# Patient Record
Sex: Male | Born: 1945 | Race: White | Hispanic: No | Marital: Single | Smoking: Former smoker
Health system: Southern US, Community
[De-identification: ages and names within clinical notes are randomized; demographics above are authoritative.]

## PROBLEM LIST (undated history)

## (undated) DIAGNOSIS — D649 Anemia, unspecified: Secondary | ICD-10-CM

## (undated) DIAGNOSIS — D696 Thrombocytopenia, unspecified: Secondary | ICD-10-CM

## (undated) DIAGNOSIS — K589 Irritable bowel syndrome without diarrhea: Secondary | ICD-10-CM

## (undated) DIAGNOSIS — D179 Benign lipomatous neoplasm, unspecified: Secondary | ICD-10-CM

## (undated) DIAGNOSIS — K219 Gastro-esophageal reflux disease without esophagitis: Secondary | ICD-10-CM

## (undated) DIAGNOSIS — D472 Monoclonal gammopathy: Secondary | ICD-10-CM

## (undated) DIAGNOSIS — R161 Splenomegaly, not elsewhere classified: Secondary | ICD-10-CM

## (undated) DIAGNOSIS — C61 Malignant neoplasm of prostate: Secondary | ICD-10-CM

## (undated) DIAGNOSIS — Z87898 Personal history of other specified conditions: Secondary | ICD-10-CM

## (undated) HISTORY — DX: Anemia, unspecified: D64.9

## (undated) HISTORY — DX: Irritable bowel syndrome, unspecified: K58.9

## (undated) HISTORY — DX: Malignant neoplasm of prostate: C61

## (undated) HISTORY — DX: Thrombocytopenia, unspecified: D69.6

## (undated) HISTORY — PX: APPENDECTOMY: SHX54

## (undated) HISTORY — DX: Gastro-esophageal reflux disease without esophagitis: K21.9

## (undated) HISTORY — DX: Monoclonal gammopathy: D47.2

## (undated) HISTORY — DX: Personal history of other specified conditions: Z87.898

## (undated) HISTORY — DX: Benign lipomatous neoplasm, unspecified: D17.9

## (undated) HISTORY — PX: OTHER SURGICAL HISTORY: SHX169

---

## 2008-09-11 ENCOUNTER — Ambulatory Visit: Payer: Self-pay | Admitting: Orthopedic Surgery

## 2011-01-29 HISTORY — PX: OTHER SURGICAL HISTORY: SHX169

## 2011-02-24 ENCOUNTER — Ambulatory Visit: Payer: Self-pay | Admitting: Radiation Oncology

## 2011-03-01 ENCOUNTER — Ambulatory Visit: Payer: Self-pay | Admitting: Radiation Oncology

## 2011-03-24 ENCOUNTER — Ambulatory Visit: Payer: Self-pay | Admitting: Radiation Oncology

## 2011-03-30 ENCOUNTER — Ambulatory Visit: Payer: Self-pay | Admitting: Urology

## 2011-04-01 ENCOUNTER — Ambulatory Visit: Payer: Self-pay | Admitting: Radiation Oncology

## 2011-04-20 LAB — URINE IEP, RANDOM

## 2011-04-21 LAB — PROT IMMUNOELECTROPHORES(ARMC)

## 2011-04-29 ENCOUNTER — Ambulatory Visit: Payer: Self-pay | Admitting: Radiation Oncology

## 2011-05-30 ENCOUNTER — Ambulatory Visit: Payer: Self-pay | Admitting: Radiation Oncology

## 2011-08-03 ENCOUNTER — Ambulatory Visit: Payer: Self-pay | Admitting: Oncology

## 2011-08-03 LAB — CBC CANCER CENTER
Basophil %: 0.5 %
HCT: 40.6 % (ref 40.0–52.0)
HGB: 13.9 g/dL (ref 13.0–18.0)
Lymphocyte #: 1.4 x10 3/mm (ref 1.0–3.6)
Lymphocyte %: 25.9 %
MCH: 29.1 pg (ref 26.0–34.0)
MCHC: 34.1 g/dL (ref 32.0–36.0)
MCV: 85 fL (ref 80–100)
Monocyte #: 0.3 x10 3/mm (ref 0.2–1.0)
Neutrophil #: 3.6 x10 3/mm (ref 1.4–6.5)
Neutrophil %: 66.4 %
RBC: 4.77 10*6/uL (ref 4.40–5.90)
WBC: 5.5 x10 3/mm (ref 3.8–10.6)

## 2011-08-29 ENCOUNTER — Ambulatory Visit: Payer: Self-pay | Admitting: Oncology

## 2011-12-19 ENCOUNTER — Ambulatory Visit: Payer: Self-pay | Admitting: Gastroenterology

## 2012-02-29 ENCOUNTER — Ambulatory Visit: Payer: Self-pay | Admitting: Radiation Oncology

## 2012-03-17 LAB — PSA: PSA: 1.3 ng/mL (ref 0.0–4.0)

## 2012-03-31 ENCOUNTER — Ambulatory Visit: Payer: Self-pay | Admitting: Radiation Oncology

## 2012-10-29 ENCOUNTER — Ambulatory Visit: Payer: Self-pay | Admitting: Oncology

## 2012-11-16 LAB — IRON AND TIBC
Iron Bind.Cap.(Total): 365 ug/dL (ref 250–450)
Iron Saturation: 28 %
Iron: 103 ug/dL (ref 65–175)
Unbound Iron-Bind.Cap.: 262 ug/dL

## 2012-11-16 LAB — CBC CANCER CENTER
Basophil %: 0.4 %
Eosinophil %: 0.9 %
HCT: 43.5 % (ref 40.0–52.0)
HGB: 15.1 g/dL (ref 13.0–18.0)
Lymphocyte #: 1.4 x10 3/mm (ref 1.0–3.6)
Lymphocyte %: 21.7 %
Neutrophil %: 72.5 %
RDW: 15.8 % — ABNORMAL HIGH (ref 11.5–14.5)

## 2012-11-16 LAB — FOLATE: Folic Acid: 8.9 ng/mL (ref 3.1–100.0)

## 2012-11-19 LAB — PROT IMMUNOELECTROPHORES(ARMC)

## 2012-11-28 ENCOUNTER — Ambulatory Visit: Payer: Self-pay | Admitting: Oncology

## 2012-12-29 ENCOUNTER — Ambulatory Visit: Payer: Self-pay | Admitting: Oncology

## 2013-03-21 ENCOUNTER — Ambulatory Visit: Payer: Self-pay | Admitting: Oncology

## 2013-03-31 ENCOUNTER — Ambulatory Visit: Payer: Self-pay | Admitting: Oncology

## 2013-06-20 ENCOUNTER — Ambulatory Visit: Payer: Self-pay | Admitting: Oncology

## 2013-06-21 LAB — BASIC METABOLIC PANEL
Anion Gap: 10 (ref 7–16)
BUN: 13 mg/dL (ref 7–18)
CREATININE: 1.08 mg/dL (ref 0.60–1.30)
Calcium, Total: 9.2 mg/dL (ref 8.5–10.1)
Chloride: 105 mmol/L (ref 98–107)
Co2: 27 mmol/L (ref 21–32)
EGFR (African American): >60
EGFR (Non-African Amer.): >60
Glucose: 99 mg/dL (ref 65–99)
OSMOLALITY: 283 (ref 275–301)
Potassium: 4.2 mmol/L (ref 3.5–5.1)
Sodium: 142 mmol/L (ref 136–145)

## 2013-06-21 LAB — CBC CANCER CENTER
BASOS PCT: 0.2 %
Basophil #: 0 x10 3/mm (ref 0.0–0.1)
Eosinophil #: 0.1 x10 3/mm (ref 0.0–0.7)
Eosinophil %: 2 %
HCT: 41.3 % (ref 40.0–52.0)
HGB: 14 g/dL (ref 13.0–18.0)
LYMPHS PCT: 27 %
Lymphocyte #: 1.2 x10 3/mm (ref 1.0–3.6)
MCH: 28.5 pg (ref 26.0–34.0)
MCHC: 33.9 g/dL (ref 32.0–36.0)
MCV: 84 fL (ref 80–100)
MONO ABS: 0.3 x10 3/mm (ref 0.2–1.0)
Monocyte %: 6.1 %
NEUTROS PCT: 64.7 %
Neutrophil #: 2.9 x10 3/mm (ref 1.4–6.5)
PLATELETS: 121 x10 3/mm — AB (ref 150–440)
RBC: 4.92 10*6/uL (ref 4.40–5.90)
RDW: 14.7 % — ABNORMAL HIGH (ref 11.5–14.5)
WBC: 4.5 x10 3/mm (ref 3.8–10.6)

## 2013-06-24 LAB — PROT IMMUNOELECTROPHORES(ARMC)

## 2013-06-24 LAB — URINE IEP, RANDOM

## 2013-06-24 LAB — KAPPA/LAMBDA FREE LIGHT CHAINS (ARMC)

## 2013-06-28 ENCOUNTER — Ambulatory Visit: Payer: Self-pay | Admitting: Oncology

## 2013-08-08 ENCOUNTER — Ambulatory Visit: Payer: Self-pay | Admitting: Oncology

## 2013-08-28 ENCOUNTER — Ambulatory Visit: Payer: Self-pay | Admitting: Oncology

## 2013-11-01 ENCOUNTER — Ambulatory Visit: Payer: Self-pay | Admitting: Internal Medicine

## 2014-03-10 ENCOUNTER — Ambulatory Visit: Payer: Self-pay | Admitting: Oncology

## 2014-03-10 LAB — CBC CANCER CENTER
Basophil #: 0 x10 3/mm (ref 0.0–0.1)
Basophil %: 0.3 %
EOS ABS: 0.1 x10 3/mm (ref 0.0–0.7)
EOS PCT: 2.3 %
HCT: 43.3 % (ref 40.0–52.0)
HGB: 14.8 g/dL (ref 13.0–18.0)
Lymphocyte #: 1.3 x10 3/mm (ref 1.0–3.6)
Lymphocyte %: 23.3 %
MCH: 28.6 pg (ref 26.0–34.0)
MCHC: 34.3 g/dL (ref 32.0–36.0)
MCV: 83 fL (ref 80–100)
MONO ABS: 0.4 x10 3/mm (ref 0.2–1.0)
Monocyte %: 6.3 %
NEUTROS PCT: 67.8 %
Neutrophil #: 3.8 x10 3/mm (ref 1.4–6.5)
PLATELETS: 132 x10 3/mm — AB (ref 150–440)
RBC: 5.19 10*6/uL (ref 4.40–5.90)
RDW: 14.8 % — ABNORMAL HIGH (ref 11.5–14.5)
WBC: 5.7 x10 3/mm (ref 3.8–10.6)

## 2014-03-10 LAB — BASIC METABOLIC PANEL
ANION GAP: 8 (ref 7–16)
BUN: 23 mg/dL — ABNORMAL HIGH (ref 7–18)
CO2: 29 mmol/L (ref 21–32)
Calcium, Total: 8.8 mg/dL (ref 8.5–10.1)
Chloride: 102 mmol/L (ref 98–107)
Creatinine: 1.15 mg/dL (ref 0.60–1.30)
EGFR (African American): >60
EGFR (Non-African Amer.): >60
Glucose: 101 mg/dL — ABNORMAL HIGH (ref 65–99)
Osmolality: 281 (ref 275–301)
Potassium: 4.7 mmol/L (ref 3.5–5.1)
Sodium: 139 mmol/L (ref 136–145)

## 2014-03-11 LAB — KAPPA/LAMBDA FREE LIGHT CHAINS (ARMC)

## 2014-03-11 LAB — PROT IMMUNOELECTROPHORES(ARMC)

## 2014-03-13 LAB — URINE IEP, RANDOM

## 2014-03-31 ENCOUNTER — Ambulatory Visit: Payer: Self-pay | Admitting: Oncology

## 2014-04-29 ENCOUNTER — Ambulatory Visit
Admit: 2014-04-29 | Disposition: A | Discharge status: Home / Self Care | Payer: Self-pay | Attending: Oncology | Admitting: Oncology

## 2014-06-22 NOTE — H&P (Signed)
PATIENT NAME:  Anthony Gregory, Anthony Gregory MR#:  [id number] DATE OF BIRTH:  February 13, 1946  DATE OF ADMISSION:  03/30/2011  HISTORY OF PRESENT ILLNESS: Mr. Naeem is a 69 year old male who presented with a slowly rising PSA from 4.6 in 2012 to a PSA of 3 in 2013.  He underwent transrectal ultrasound-guided biopsy by Dr. Bernardo Heater showing one focus of Gleason 6 (3 + 3) from his right mid lateral lobe. All other biopsies were negative. Treatment options were discussed. He underwent consultation in my office on 02/24/2011, and it was opted he would undergo an I-125 interstitial implant. He is seen today for his preop history and physical and volume study. He is doing well and is without complaint.   PAST MEDICAL/SURGICAL HISTORY:  1. Detached tendon of his left hand in 2009.  2. Prostate cancer.  3. Irritable bowel syndrome.  4. Allergic rhinitis.   FAMILY HISTORY: Family history of hypertension and hyperlipidemia. No family history of cancer.   SOCIAL HISTORY: A 40 pack-year smoking history. Social EtOH use history.   ALLERGIES: No known allergies.   REVIEW OF SYSTEMS: Review of systems is unchanged from prior examination performed on 02/24/2011.   PHYSICAL EXAMINATION:  VITAL SIGNS: Vital signs are stable. Temperature 96, respirations 20 per minute, blood pressure is 90/49.   GENERAL: A well-developed, well-nourished male in no acute distress.   HEENT: Pupils are equal, round and reactive to light and accommodation. Extraocular movements intact. Discs not visualized.   NECK: Clear without evidence of cervical or supraclavicular adenopathy.   LUNGS: Clear to auscultation and percussion.   HEART: Regular rate and rhythm without murmur, rub or thrill.   ABDOMEN: Benign with no organomegaly or masses noted.   NEUROLOGICAL: Motor, sensory, and deep tendon reflex levels are equal and symmetric in the upper and lower extremities.   RECTAL: Exam was deferred today.   IMPRESSION: Stage I adenocarcinoma  of the prostate in a 69 year old male.   PLAN: At this point in time, the patient will have a volume study to determine seed positioning in his prostate for his I-125 interstitial implant. The risks and benefits of seed implantation were discussed with the patient, and he seems to comprehend our treatment plan well. The patient is scheduled for implant in early February. He is cleared now to proceed with implant.   Thank you for the opportunity for allowing me to continue to participate in his care.  ____________________________ Armstead Peaks, MD gsc:cbb D: 03/24/2011 11:59:40 ET T: 03/24/2011 12:21:24 ET JOB#: [id number]  cc: Armstead Peaks, MD, <Dictator> Armstead Peaks MD ELECTRONICALLY SIGNED 03/24/2011 15:05

## 2014-09-03 ENCOUNTER — Other Ambulatory Visit: Payer: Self-pay

## 2014-09-03 DIAGNOSIS — D472 Monoclonal gammopathy: Secondary | ICD-10-CM

## 2014-09-04 ENCOUNTER — Inpatient Hospital Stay: Payer: Medicare Other | Attending: Oncology

## 2014-09-04 DIAGNOSIS — Z7982 Long term (current) use of aspirin: Secondary | ICD-10-CM | POA: Diagnosis not present

## 2014-09-04 DIAGNOSIS — K219 Gastro-esophageal reflux disease without esophagitis: Secondary | ICD-10-CM | POA: Insufficient documentation

## 2014-09-04 DIAGNOSIS — K589 Irritable bowel syndrome without diarrhea: Secondary | ICD-10-CM | POA: Insufficient documentation

## 2014-09-04 DIAGNOSIS — D649 Anemia, unspecified: Secondary | ICD-10-CM | POA: Diagnosis not present

## 2014-09-04 DIAGNOSIS — Z87891 Personal history of nicotine dependence: Secondary | ICD-10-CM | POA: Diagnosis not present

## 2014-09-04 DIAGNOSIS — D696 Thrombocytopenia, unspecified: Secondary | ICD-10-CM | POA: Diagnosis not present

## 2014-09-04 DIAGNOSIS — R161 Splenomegaly, not elsewhere classified: Secondary | ICD-10-CM | POA: Diagnosis not present

## 2014-09-04 DIAGNOSIS — D472 Monoclonal gammopathy: Secondary | ICD-10-CM | POA: Diagnosis present

## 2014-09-04 DIAGNOSIS — Z8546 Personal history of malignant neoplasm of prostate: Secondary | ICD-10-CM | POA: Insufficient documentation

## 2014-09-04 LAB — CBC
HEMATOCRIT: 42.9 % (ref 40.0–52.0)
Hemoglobin: 14.5 g/dL (ref 13.0–18.0)
MCH: 28.7 pg (ref 26.0–34.0)
MCHC: 33.8 g/dL (ref 32.0–36.0)
MCV: 84.8 fL (ref 80.0–100.0)
Platelets: 120 10*3/uL — ABNORMAL LOW (ref 150–440)
RBC: 5.06 MIL/uL (ref 4.40–5.90)
RDW: 14.7 % — AB (ref 11.5–14.5)
WBC: 4.2 10*3/uL (ref 3.8–10.6)

## 2014-09-04 LAB — BASIC METABOLIC PANEL
ANION GAP: 4 — AB (ref 5–15)
BUN: 20 mg/dL (ref 6–20)
CO2: 25 mmol/L (ref 22–32)
CREATININE: 1.18 mg/dL (ref 0.61–1.24)
Calcium: 8.6 mg/dL — ABNORMAL LOW (ref 8.9–10.3)
Chloride: 105 mmol/L (ref 101–111)
GLUCOSE: 101 mg/dL — AB (ref 65–99)
POTASSIUM: 4.1 mmol/L (ref 3.5–5.1)
Sodium: 134 mmol/L — ABNORMAL LOW (ref 135–145)

## 2014-09-05 LAB — PROTEIN ELECTROPHORESIS, SERUM
A/G Ratio: 1.4 (ref 0.7–1.7)
ALBUMIN ELP: 3.9 g/dL (ref 2.9–4.4)
Alpha-1-Globulin: 0.2 g/dL (ref 0.0–0.4)
Alpha-2-Globulin: 0.4 g/dL (ref 0.4–1.0)
Beta Globulin: 1.4 g/dL — ABNORMAL HIGH (ref 0.7–1.3)
GAMMA GLOBULIN: 0.8 g/dL (ref 0.4–1.8)
Globulin, Total: 2.8 g/dL (ref 2.2–3.9)
M-Spike, %: 0.1 g/dL — ABNORMAL HIGH
Total Protein ELP: 6.7 g/dL (ref 6.0–8.5)

## 2014-09-05 LAB — PROTEIN ELECTRO, RANDOM URINE
Albumin ELP, Urine: 23.5 %
Alpha-1-Globulin, U: 4.4 %
Alpha-2-Globulin, U: 10 %
Beta Globulin, U: 46.4 %
GAMMA GLOBULIN, U: 15.7 %
Total Protein, Urine: 7.2 mg/dL

## 2014-09-05 LAB — KAPPA/LAMBDA LIGHT CHAINS
Kappa free light chain: 28.36 mg/L — ABNORMAL HIGH (ref 3.30–19.40)
Kappa, lambda light chain ratio: 4.48 — ABNORMAL HIGH (ref 0.26–1.65)
LAMDA FREE LIGHT CHAINS: 6.33 mg/L (ref 5.71–26.30)

## 2014-09-11 ENCOUNTER — Ambulatory Visit: Payer: Self-pay | Admitting: Oncology

## 2014-09-15 ENCOUNTER — Encounter: Payer: Self-pay | Admitting: Oncology

## 2014-09-15 ENCOUNTER — Inpatient Hospital Stay (HOSPITAL_BASED_OUTPATIENT_CLINIC_OR_DEPARTMENT_OTHER): Payer: Medicare Other | Admitting: Oncology

## 2014-09-15 VITALS — BP 142/77 | HR 67 | Temp 96.2°F | Resp 20 | Wt 190.9 lb

## 2014-09-15 DIAGNOSIS — Z7982 Long term (current) use of aspirin: Secondary | ICD-10-CM

## 2014-09-15 DIAGNOSIS — K589 Irritable bowel syndrome without diarrhea: Secondary | ICD-10-CM | POA: Diagnosis not present

## 2014-09-15 DIAGNOSIS — D472 Monoclonal gammopathy: Secondary | ICD-10-CM

## 2014-09-15 DIAGNOSIS — K219 Gastro-esophageal reflux disease without esophagitis: Secondary | ICD-10-CM

## 2014-09-15 DIAGNOSIS — D649 Anemia, unspecified: Secondary | ICD-10-CM | POA: Diagnosis not present

## 2014-09-15 DIAGNOSIS — R161 Splenomegaly, not elsewhere classified: Secondary | ICD-10-CM

## 2014-09-15 DIAGNOSIS — Z87891 Personal history of nicotine dependence: Secondary | ICD-10-CM

## 2014-09-15 DIAGNOSIS — C61 Malignant neoplasm of prostate: Secondary | ICD-10-CM

## 2014-09-15 DIAGNOSIS — D696 Thrombocytopenia, unspecified: Secondary | ICD-10-CM

## 2014-09-15 DIAGNOSIS — Z8546 Personal history of malignant neoplasm of prostate: Secondary | ICD-10-CM

## 2014-09-27 NOTE — Progress Notes (Signed)
[place]  Telephone:(336) [phone] Fax:(336) [phone]  ID: Anthony Gregory OB: [birth year]  MR#: [id number]  XBL#:[id number]  Patient Care Team: Adin Hector, MD as PCP - General (Internal Medicine)  CHIEF COMPLAINT:  Chief Complaint  Patient presents with  . Follow-up    MGUS, thrombocytopenia    INTERVAL HISTORY: Patient returns to clinic today for further evaluation and discussion of his laboratory work.  He continues to feel well and is asymptomatic.  He denies any recent fevers or illnesses.  He has a good appetite and denies weight loss.  He has no bony pain.  He has no neurologic complaints.  He denies any easy bleeding or bruising.  He has no chest pain or shortness of breath.  He denies any nausea, vomiting, constipation, or diarrhea.  He has no melena or hematochezia.  He has no urinary complaints.  Patient offers no specific complaints today.   REVIEW OF SYSTEMS:   Review of Systems  Constitutional: Negative.   Musculoskeletal: Negative.   Neurological: Negative.     As per HPI. Otherwise, a complete review of systems is negatve.  PAST MEDICAL HISTORY: Past Medical History  Diagnosis Date  . Irritable bowel syndrome   . GERD (gastroesophageal reflux disease)   . History of splenomegaly   . Prostate cancer   . Anemia   . Thrombocytopenia   . Lipoma   . MGUS (monoclonal gammopathy of unknown significance)     PAST SURGICAL HISTORY: No past surgical history on file.  FAMILY HISTORY: Hypertension, hyperlipidemia.     ADVANCED DIRECTIVES:    HEALTH MAINTENANCE: History  Substance Use Topics  . Smoking status: Former Research scientist (life sciences)  . Smokeless tobacco: Never Used  . Alcohol Use: No     Colonoscopy:  PAP:  Bone density:  Lipid panel:  No Known Allergies  Current Outpatient Prescriptions  Medication Sig Dispense Refill  . aspirin EC 81 MG tablet Take 81 mg by mouth.     No current facility-administered medications for this  visit.    OBJECTIVE: Filed Vitals:   09/15/14 1107  BP: 142/77  Pulse: 67  Temp: 96.2 F (35.7 C)  Resp: 20     There is no height on file to calculate BMI.    ECOG FS:0 - Asymptomatic  General: Well-developed, well-nourished, no acute distress. Eyes: Pink conjunctiva, anicteric sclera. Lungs: Clear to auscultation bilaterally. Heart: Regular rate and rhythm. No rubs, murmurs, or gallops. Abdomen: Soft, nontender, nondistended. No organomegaly noted, normoactive bowel sounds. Musculoskeletal: No edema, cyanosis, or clubbing. Neuro: Alert, answering all questions appropriately. Cranial nerves grossly intact. Skin: No rashes or petechiae noted. Psych: Normal affect.   LAB RESULTS:  Lab Results  Component Value Date   NA 134* 09/04/2014   K 4.1 09/04/2014   CL 105 09/04/2014   CO2 25 09/04/2014   GLUCOSE 101* 09/04/2014   BUN 20 09/04/2014   CREATININE 1.18 09/04/2014   CALCIUM 8.6* 09/04/2014   GFRNONAA >60 09/04/2014   GFRAA >60 09/04/2014    Lab Results  Component Value Date   WBC 4.2 09/04/2014   NEUTROABS 3.8 03/10/2014   HGB 14.5 09/04/2014   HCT 42.9 09/04/2014   MCV 84.8 09/04/2014   PLT 120* 09/04/2014     STUDIES: No results found.  ASSESSMENT: MGUS, thrombocytopenia.  PLAN:    1.  MGUS: Patient most recent M spike 0.1 down from 0.46 months ago. He has a mildly elevated Lambda light chain ratio at 4.48.  Patient also had a metastatic bone survey in the past which was negative.  This does not need to be repeated unless there is suspicion of progression of disease. Return to clinic in 6 months with repeat laboratory work and further evaluation.  2.  Thrombocytopenia: Decreased but essentially unchanged.  Patient previously reported he has splenomegaly, but we do not have documentation of this. 3.  Anemia: Resolved.  Patient's hemoglobin and iron stores are within normal limits. 4.  History of prostate cancer: Patient's PSAs are monitored by his  PCP.   Patient expressed understanding and was in agreement with this plan. He also understands that He can call clinic at any time with any questions, concerns, or complaints.    [name], MD   09/27/2014 3:56 PM

## 2015-03-18 ENCOUNTER — Inpatient Hospital Stay: Payer: Medicare Other | Attending: Oncology

## 2015-03-18 DIAGNOSIS — D472 Monoclonal gammopathy: Secondary | ICD-10-CM | POA: Insufficient documentation

## 2015-03-18 DIAGNOSIS — C61 Malignant neoplasm of prostate: Secondary | ICD-10-CM

## 2015-03-18 DIAGNOSIS — D179 Benign lipomatous neoplasm, unspecified: Secondary | ICD-10-CM | POA: Diagnosis not present

## 2015-03-18 DIAGNOSIS — Z7982 Long term (current) use of aspirin: Secondary | ICD-10-CM | POA: Diagnosis not present

## 2015-03-18 DIAGNOSIS — D649 Anemia, unspecified: Secondary | ICD-10-CM | POA: Insufficient documentation

## 2015-03-18 DIAGNOSIS — Z87891 Personal history of nicotine dependence: Secondary | ICD-10-CM | POA: Insufficient documentation

## 2015-03-18 DIAGNOSIS — K589 Irritable bowel syndrome without diarrhea: Secondary | ICD-10-CM | POA: Insufficient documentation

## 2015-03-18 DIAGNOSIS — Z8546 Personal history of malignant neoplasm of prostate: Secondary | ICD-10-CM | POA: Insufficient documentation

## 2015-03-18 DIAGNOSIS — K219 Gastro-esophageal reflux disease without esophagitis: Secondary | ICD-10-CM | POA: Insufficient documentation

## 2015-03-18 DIAGNOSIS — D696 Thrombocytopenia, unspecified: Secondary | ICD-10-CM | POA: Insufficient documentation

## 2015-03-18 LAB — BASIC METABOLIC PANEL
Anion gap: 5 (ref 5–15)
BUN: 13 mg/dL (ref 6–20)
CHLORIDE: 100 mmol/L — AB (ref 101–111)
CO2: 28 mmol/L (ref 22–32)
Calcium: 9.2 mg/dL (ref 8.9–10.3)
Creatinine, Ser: 1.1 mg/dL (ref 0.61–1.24)
GFR calc Af Amer: >60 mL/min (ref >60)
Glucose, Bld: 107 mg/dL — ABNORMAL HIGH (ref 65–99)
POTASSIUM: 3.9 mmol/L (ref 3.5–5.1)
Sodium: 133 mmol/L — ABNORMAL LOW (ref 135–145)

## 2015-03-18 LAB — CBC WITH DIFFERENTIAL/PLATELET
BASOS ABS: 0 10*3/uL (ref 0–0.1)
Basophils Relative: 1 %
EOS ABS: 0 10*3/uL (ref 0–0.7)
EOS PCT: 1 %
HCT: 43.7 % (ref 40.0–52.0)
Hemoglobin: 15.1 g/dL (ref 13.0–18.0)
LYMPHS ABS: 1.3 10*3/uL (ref 1.0–3.6)
LYMPHS PCT: 23 %
MCH: 28.5 pg (ref 26.0–34.0)
MCHC: 34.5 g/dL (ref 32.0–36.0)
MCV: 82.7 fL (ref 80.0–100.0)
Monocytes Absolute: 0.4 10*3/uL (ref 0.2–1.0)
Monocytes Relative: 7 %
NEUTROS PCT: 68 %
Neutro Abs: 3.9 10*3/uL (ref 1.4–6.5)
Platelets: 139 10*3/uL — ABNORMAL LOW (ref 150–440)
RBC: 5.29 MIL/uL (ref 4.40–5.90)
RDW: 14.6 % — ABNORMAL HIGH (ref 11.5–14.5)
WBC: 5.6 10*3/uL (ref 3.8–10.6)

## 2015-03-18 LAB — PSA: PSA: 0.33 ng/mL (ref 0.00–4.00)

## 2015-03-19 LAB — PROTEIN ELECTROPHORESIS, SERUM
A/G Ratio: 1.2 (ref 0.7–1.7)
Albumin ELP: 4.1 g/dL (ref 2.9–4.4)
Alpha-1-Globulin: 0.2 g/dL (ref 0.0–0.4)
Alpha-2-Globulin: 0.5 g/dL (ref 0.4–1.0)
Beta Globulin: 1.6 g/dL — ABNORMAL HIGH (ref 0.7–1.3)
GAMMA GLOBULIN: 0.9 g/dL (ref 0.4–1.8)
Globulin, Total: 3.3 g/dL (ref 2.2–3.9)
M-SPIKE, %: 0.1 g/dL — AB
Total Protein ELP: 7.4 g/dL (ref 6.0–8.5)

## 2015-03-20 LAB — PROTEIN ELECTRO, RANDOM URINE
Albumin ELP, Urine: 50.5 %
Alpha-1-Globulin, U: 2.1 %
Alpha-2-Globulin, U: 12.4 %
Beta Globulin, U: 29.9 %
Gamma Globulin, U: 5.1 %
Total Protein, Urine: 7.3 mg/dL

## 2015-03-25 ENCOUNTER — Inpatient Hospital Stay (HOSPITAL_BASED_OUTPATIENT_CLINIC_OR_DEPARTMENT_OTHER): Payer: Medicare Other | Admitting: Oncology

## 2015-03-25 VITALS — BP 150/83 | HR 67 | Temp 97.1°F | Resp 18 | Wt 200.6 lb

## 2015-03-25 DIAGNOSIS — Z8546 Personal history of malignant neoplasm of prostate: Secondary | ICD-10-CM

## 2015-03-25 DIAGNOSIS — K589 Irritable bowel syndrome without diarrhea: Secondary | ICD-10-CM

## 2015-03-25 DIAGNOSIS — D696 Thrombocytopenia, unspecified: Secondary | ICD-10-CM | POA: Diagnosis not present

## 2015-03-25 DIAGNOSIS — D472 Monoclonal gammopathy: Secondary | ICD-10-CM | POA: Diagnosis not present

## 2015-03-25 DIAGNOSIS — Z7982 Long term (current) use of aspirin: Secondary | ICD-10-CM | POA: Diagnosis not present

## 2015-03-25 DIAGNOSIS — D179 Benign lipomatous neoplasm, unspecified: Secondary | ICD-10-CM

## 2015-03-25 DIAGNOSIS — D649 Anemia, unspecified: Secondary | ICD-10-CM

## 2015-03-25 DIAGNOSIS — K219 Gastro-esophageal reflux disease without esophagitis: Secondary | ICD-10-CM

## 2015-03-25 DIAGNOSIS — Z87891 Personal history of nicotine dependence: Secondary | ICD-10-CM

## 2015-03-27 NOTE — Progress Notes (Signed)
[place]  Telephone:(336) [phone] Fax:(336) [phone]  ID: Anthony Gregory OB: [birth year]  MR#: [id number]  [id number]  Patient Care Team: Adin Hector, MD as PCP - General (Internal Medicine)  CHIEF COMPLAINT:  Chief Complaint  Patient presents with  . MGUS    INTERVAL HISTORY: Patient returns to clinic today for further evaluation and discussion of his laboratory work.  He continues to feel well and is asymptomatic.  He denies any recent fevers or illnesses.  He has a good appetite and denies weight loss.  He has no bony pain.  He has no neurologic complaints.  He denies any easy bleeding or bruising.  He has no chest pain or shortness of breath.  He denies any nausea, vomiting, constipation, or diarrhea.  He has no melena or hematochezia.  He has no urinary complaints.  Patient offers no specific complaints today.   REVIEW OF SYSTEMS:   Review of Systems  Constitutional: Negative for fever and malaise/fatigue.  Respiratory: Negative.  Negative for shortness of breath.   Cardiovascular: Negative.  Negative for chest pain.  Gastrointestinal: Negative.   Musculoskeletal: Negative.   Neurological: Negative.  Negative for weakness.    As per HPI. Otherwise, a complete review of systems is negatve.  PAST MEDICAL HISTORY: Past Medical History  Diagnosis Date  . Irritable bowel syndrome   . GERD (gastroesophageal reflux disease)   . History of splenomegaly   . Prostate cancer   . Anemia   . Thrombocytopenia   . Lipoma   . MGUS (monoclonal gammopathy of unknown significance)     PAST SURGICAL HISTORY: No past surgical history on file.  FAMILY HISTORY: Hypertension, hyperlipidemia.     ADVANCED DIRECTIVES:    HEALTH MAINTENANCE: Social History  Substance Use Topics  . Smoking status: Former Research scientist (life sciences)  . Smokeless tobacco: Never Used  . Alcohol Use: No     Colonoscopy:  PAP:  Bone density:  Lipid panel:  No Known  Allergies  Current Outpatient Prescriptions  Medication Sig Dispense Refill  . aspirin EC 81 MG tablet Take 81 mg by mouth.     No current facility-administered medications for this visit.    OBJECTIVE: Filed Vitals:   03/25/15 0951  BP: 150/83  Pulse: 67  Temp: 97.1 F (36.2 C)  Resp: 18     There is no height on file to calculate BMI.    ECOG FS:0 - Asymptomatic  General: Well-developed, well-nourished, no acute distress. Eyes: Pink conjunctiva, anicteric sclera. Lungs: Clear to auscultation bilaterally. Heart: Regular rate and rhythm. No rubs, murmurs, or gallops. Abdomen: Soft, nontender, nondistended. No organomegaly noted, normoactive bowel sounds. Musculoskeletal: No edema, cyanosis, or clubbing. Neuro: Alert, answering all questions appropriately. Cranial nerves grossly intact. Skin: No rashes or petechiae noted. Psych: Normal affect.   LAB RESULTS:  Lab Results  Component Value Date   NA 133* 03/18/2015   K 3.9 03/18/2015   CL 100* 03/18/2015   CO2 28 03/18/2015   GLUCOSE 107* 03/18/2015   BUN 13 03/18/2015   CREATININE 1.10 03/18/2015   CALCIUM 9.2 03/18/2015   GFRNONAA >60 03/18/2015   GFRAA >60 03/18/2015    Lab Results  Component Value Date   WBC 5.6 03/18/2015   NEUTROABS 3.9 03/18/2015   HGB 15.1 03/18/2015   HCT 43.7 03/18/2015   MCV 82.7 03/18/2015   PLT 139* 03/18/2015     STUDIES: No results found.  ASSESSMENT: MGUS, thrombocytopenia.  PLAN:    1.  MGUS: Patient most recent serum M spike is unchanged at 0.1. He does not have a M spike in his urine. He has no evidence of end organ damage. Patient also had a metastatic bone survey in the past which was negative.  This does not need to be repeated unless there is suspicion of progression of disease. Return to clinic in 1 year with repeat laboratory work and further evaluation.  2.  Thrombocytopenia: Decreased but essentially unchanged.  Patient previously reported he has splenomegaly,  but we do not have documentation of this. 3.  Anemia: Resolved.  Patient's hemoglobin and iron stores are within normal limits. 4.  History of prostate cancer: PSA 0.33.  Monitor.   Patient expressed understanding and was in agreement with this plan. He also understands that He can call clinic at any time with any questions, concerns, or complaints.    [name], MD   03/27/2015 9:33 AM

## 2015-04-09 ENCOUNTER — Ambulatory Visit: Payer: Self-pay | Admitting: Radiation Oncology

## 2015-04-16 ENCOUNTER — Encounter: Payer: Self-pay | Admitting: Radiation Oncology

## 2015-04-16 ENCOUNTER — Ambulatory Visit
Admission: RE | Admit: 2015-04-16 | Discharge: 2015-04-16 | Disposition: A | Discharge status: Home / Self Care | Payer: Medicare Other | Source: Ambulatory Visit | Attending: Radiation Oncology | Admitting: Radiation Oncology

## 2015-04-16 VITALS — BP 141/83 | HR 72 | Temp 96.0°F | Wt 194.8 lb

## 2015-04-16 DIAGNOSIS — C61 Malignant neoplasm of prostate: Secondary | ICD-10-CM

## 2015-04-16 NOTE — Progress Notes (Signed)
Radiation Oncology Follow up Note  Name: Anthony Gregory   Date:   04/16/2015 MRN:  [id number] DOB: [birth year]    This 70 y.o. male presents to the clinic today for follow-up for prostate cancer Gleason 6 now out 3 years status post I-125 interstitial implant.  REFERRING PROVIDER: Adin Hector, MD  HPI: Patient is a 70 year old male now out over 3 years having completed I-125 interstitial implant back in 2012 for a Gleason 6 (3+3) adenocarcinoma the prostate presenting the PSA of 4.6. Seen today in routine follow-up he continues to do well last PSA was in January was 0.3. He specifically denies diarrhea dysuria or any other GI/GU complaints..  COMPLICATIONS OF TREATMENT: none  FOLLOW UP COMPLIANCE: keeps appointments   PHYSICAL EXAM:  BP 141/83 mmHg  Pulse 72  Temp(Src) 96 F (35.6 C)  Wt 194 lb 12.4 oz (88.35 kg) On rectal exam rectal sphincter tone is good. Prostate is smooth contracted without evidence of nodularity or mass. Sulcus is preserved bilaterally. No discrete nodularity is identified. No other rectal abnormalities are noted. Well-developed well-nourished patient in NAD. HEENT reveals PERLA, EOMI, discs not visualized.  Oral cavity is clear. No oral mucosal lesions are identified. Neck is clear without evidence of cervical or supraclavicular adenopathy. Lungs are clear to A&P. Cardiac examination is essentially unremarkable with regular rate and rhythm without murmur rub or thrill. Abdomen is benign with no organomegaly or masses noted. Motor sensory and DTR levels are equal and symmetric in the upper and lower extremities. Cranial nerves II through XII are grossly intact. Proprioception is intact. No peripheral adenopathy or edema is identified. No motor or sensory levels are noted. Crude visual fields are within normal range.  RADIOLOGY RESULTS: No current films for review  PLAN: Present time he continues to do well under good biochemical control of his prostate cancer.  I am please was overall progress. I've asked to see him back in a year for follow-up. Patient knows to call sooner with any concerns. He continues follow-up care with medical oncology for anemia and thrombocytopenia which appears to be stable.  I would like to take this opportunity for allowing me to participate in the care of your patient.Armstead Peaks., MD

## 2015-12-25 ENCOUNTER — Encounter: Payer: Self-pay | Admitting: *Deleted

## 2016-03-18 ENCOUNTER — Other Ambulatory Visit: Payer: Self-pay | Admitting: *Deleted

## 2016-03-18 DIAGNOSIS — D472 Monoclonal gammopathy: Secondary | ICD-10-CM

## 2016-03-24 ENCOUNTER — Other Ambulatory Visit: Payer: Medicare Other

## 2016-03-31 ENCOUNTER — Ambulatory Visit: Payer: Medicare Other | Admitting: Oncology

## 2016-04-19 ENCOUNTER — Inpatient Hospital Stay: Payer: Medicare Other | Attending: Oncology

## 2016-04-19 DIAGNOSIS — K219 Gastro-esophageal reflux disease without esophagitis: Secondary | ICD-10-CM | POA: Diagnosis not present

## 2016-04-19 DIAGNOSIS — Z8546 Personal history of malignant neoplasm of prostate: Secondary | ICD-10-CM | POA: Diagnosis not present

## 2016-04-19 DIAGNOSIS — Z7982 Long term (current) use of aspirin: Secondary | ICD-10-CM | POA: Diagnosis not present

## 2016-04-19 DIAGNOSIS — K589 Irritable bowel syndrome without diarrhea: Secondary | ICD-10-CM | POA: Insufficient documentation

## 2016-04-19 DIAGNOSIS — D696 Thrombocytopenia, unspecified: Secondary | ICD-10-CM | POA: Insufficient documentation

## 2016-04-19 DIAGNOSIS — Z923 Personal history of irradiation: Secondary | ICD-10-CM | POA: Insufficient documentation

## 2016-04-19 DIAGNOSIS — Z87891 Personal history of nicotine dependence: Secondary | ICD-10-CM | POA: Diagnosis not present

## 2016-04-19 DIAGNOSIS — D472 Monoclonal gammopathy: Secondary | ICD-10-CM | POA: Diagnosis not present

## 2016-04-19 LAB — BASIC METABOLIC PANEL
ANION GAP: 6 (ref 5–15)
BUN: 17 mg/dL (ref 6–20)
CHLORIDE: 107 mmol/L (ref 101–111)
CO2: 25 mmol/L (ref 22–32)
Calcium: 9.1 mg/dL (ref 8.9–10.3)
Creatinine, Ser: 1.2 mg/dL (ref 0.61–1.24)
GFR calc Af Amer: >60 mL/min (ref >60)
GFR calc non Af Amer: 60 mL/min — ABNORMAL LOW (ref >60)
GLUCOSE: 97 mg/dL (ref 65–99)
POTASSIUM: 4.2 mmol/L (ref 3.5–5.1)
Sodium: 138 mmol/L (ref 135–145)

## 2016-04-19 LAB — CBC
HEMATOCRIT: 41.4 % (ref 40.0–52.0)
HEMOGLOBIN: 14.3 g/dL (ref 13.0–18.0)
MCH: 29 pg (ref 26.0–34.0)
MCHC: 34.6 g/dL (ref 32.0–36.0)
MCV: 83.9 fL (ref 80.0–100.0)
PLATELETS: 138 10*3/uL — AB (ref 150–440)
RBC: 4.93 MIL/uL (ref 4.40–5.90)
RDW: 14.6 % — ABNORMAL HIGH (ref 11.5–14.5)
WBC: 5.7 10*3/uL (ref 3.8–10.6)

## 2016-04-20 LAB — PROTEIN ELECTROPHORESIS, SERUM
A/G RATIO SPE: 1.4 (ref 0.7–1.7)
ALBUMIN ELP: 3.8 g/dL (ref 2.9–4.4)
ALPHA-1-GLOBULIN: 0.2 g/dL (ref 0.0–0.4)
ALPHA-2-GLOBULIN: 0.5 g/dL (ref 0.4–1.0)
BETA GLOBULIN: 1.3 g/dL (ref 0.7–1.3)
GAMMA GLOBULIN: 0.8 g/dL (ref 0.4–1.8)
Globulin, Total: 2.8 (ref 2.2–3.9)
M-Spike, %: 0.1 g/dL — ABNORMAL HIGH
Total Protein ELP: 6.6 g/dL (ref 6.0–8.5)

## 2016-04-20 LAB — KAPPA/LAMBDA LIGHT CHAINS
KAPPA, LAMDA LIGHT CHAIN RATIO: 2.27 — AB (ref 0.26–1.65)
Kappa free light chain: 22.2 mg/L — ABNORMAL HIGH (ref 3.3–19.4)
Lambda free light chains: 9.8 mg/L (ref 5.7–26.3)

## 2016-04-22 ENCOUNTER — Ambulatory Visit: Payer: Medicare Other | Admitting: Radiation Oncology

## 2016-04-25 NOTE — Progress Notes (Signed)
[place]  Telephone:(336) [phone] Fax:(336) [phone]  ID: Caleen Essex OB: 07-04-1945  MR#: [id number]  [id number]  Patient Care Team: Adin Hector, MD as PCP - General (Internal Medicine)  CHIEF COMPLAINT: MGUS, thrombocytopenia.  INTERVAL HISTORY: Patient returns to clinic today for routine yearly evaluation and discussion of his laboratory work.  He continues to feel well and is asymptomatic.  He denies any recent fevers or illnesses.  He has a good appetite and denies weight loss.  He has no bony pain.  He has no neurologic complaints.  He denies any easy bleeding or bruising.  He has no chest pain or shortness of breath.  He denies any nausea, vomiting, constipation, or diarrhea.  He has no melena or hematochezia.  He has no urinary complaints.  Patient offers no specific complaints today.   REVIEW OF SYSTEMS:   Review of Systems  Constitutional: Negative for fever, malaise/fatigue and weight loss.  Respiratory: Negative.  Negative for cough and shortness of breath.   Cardiovascular: Negative.  Negative for chest pain and leg swelling.  Gastrointestinal: Negative.  Negative for abdominal pain.  Musculoskeletal: Negative.   Neurological: Negative.  Negative for weakness.  Psychiatric/Behavioral: Negative.  The patient is not nervous/anxious.     As per HPI. Otherwise, a complete review of systems is negative.  PAST MEDICAL HISTORY: Past Medical History:  Diagnosis Date  . Anemia   . GERD (gastroesophageal reflux disease)   . History of splenomegaly   . Irritable bowel syndrome   . Lipoma   . MGUS (monoclonal gammopathy of unknown significance)   . [place]state cancer ([place])   . Thrombocytopenia ([place])     PAST SURGICAL HISTORY: No past surgical history on file.  FAMILY HISTORY: Hypertension, hyperlipidemia.     ADVANCED DIRECTIVES:    HEALTH MAINTENANCE: Social History  Substance Use Topics  . Smoking status: Former Research scientist (life sciences)  .  Smokeless tobacco: Never Used  . Alcohol use No     Colonoscopy:  PAP:  Bone density:  Lipid panel:  No Known Allergies  Current Outpatient Prescriptions  Medication Sig Dispense Refill  . aspirin EC 81 MG tablet Take 81 mg by mouth.     No current facility-administered medications for this visit.     OBJECTIVE: Vitals:   04/26/16 1122  BP: (!) 158/81  Pulse: 68  Resp: 18  Temp: 97 F (36.1 C)     There is no height or weight on file to calculate BMI.    ECOG FS:0 - Asymptomatic  General: Well-developed, well-nourished, no acute distress. Eyes: Pink conjunctiva, anicteric sclera. Lungs: Clear to auscultation bilaterally. Heart: Regular rate and rhythm. No rubs, murmurs, or gallops. Abdomen: Soft, nontender, nondistended. No organomegaly noted, normoactive bowel sounds. Musculoskeletal: No edema, cyanosis, or clubbing. Neuro: Alert, answering all questions appropriately. Cranial nerves grossly intact. Skin: No rashes or petechiae noted. Psych: Normal affect.   LAB RESULTS:  Lab Results  Component Value Date   NA 138 04/19/2016   K 4.2 04/19/2016   CL 107 04/19/2016   CO2 25 04/19/2016   GLUCOSE 97 04/19/2016   BUN 17 04/19/2016   CREATININE 1.20 04/19/2016   CALCIUM 9.1 04/19/2016   GFRNONAA 60 (L) 04/19/2016   GFRAA >60 04/19/2016    Lab Results  Component Value Date   WBC 5.7 04/19/2016   NEUTROABS 3.9 03/18/2015   HGB 14.3 04/19/2016   HCT 41.4 04/19/2016   MCV 83.9 04/19/2016   PLT 138 (L) 04/19/2016  Lab Results  Component Value Date   TOTALPROTELP 6.6 04/19/2016   ALBUMINELP 3.8 04/19/2016   A1GS 0.2 04/19/2016   A2GS 0.5 04/19/2016   BETS 1.3 04/19/2016   GAMS 0.8 04/19/2016   MSPIKE 0.1 (H) 04/19/2016   SPEI Comment 04/19/2016     STUDIES: No results found.  ASSESSMENT: MGUS, thrombocytopenia.  PLAN:    1.  MGUS: Patient most recent serum M spike is 0.1 which is unchanged since July 2016. He does not have a M spike in his  urine. He has no evidence of end organ damage. Patient also had a metastatic bone survey in the past which was negative. This does not need to be repeated unless there is suspicion of progression of disease. Return to clinic in 1 year with repeat laboratory work and further evaluation.  2.  Thrombocytopenia: Decreased but essentially unchanged.  Patient previously reported he has splenomegaly, but we do not have documentation of this. 3.  Anemia: Resolved.  Patient's hemoglobin and iron stores are within normal limits. 4.  History of prostate cancer: Patient has had XRT past. Continue follow-up with radiation oncology as indicated.  Patient expressed understanding and was in agreement with this plan. He also understands that He can call clinic at any time with any questions, concerns, or complaints.    [name], MD   04/30/2016 7:47 AM

## 2016-04-26 ENCOUNTER — Inpatient Hospital Stay (HOSPITAL_BASED_OUTPATIENT_CLINIC_OR_DEPARTMENT_OTHER): Payer: Medicare Other | Admitting: Oncology

## 2016-04-26 VITALS — BP 158/81 | HR 68 | Temp 97.0°F | Resp 18 | Wt 199.1 lb

## 2016-04-26 DIAGNOSIS — Z8546 Personal history of malignant neoplasm of prostate: Secondary | ICD-10-CM | POA: Diagnosis not present

## 2016-04-26 DIAGNOSIS — Z87891 Personal history of nicotine dependence: Secondary | ICD-10-CM | POA: Diagnosis not present

## 2016-04-26 DIAGNOSIS — Z7982 Long term (current) use of aspirin: Secondary | ICD-10-CM | POA: Diagnosis not present

## 2016-04-26 DIAGNOSIS — D696 Thrombocytopenia, unspecified: Secondary | ICD-10-CM | POA: Diagnosis not present

## 2016-04-26 DIAGNOSIS — K219 Gastro-esophageal reflux disease without esophagitis: Secondary | ICD-10-CM

## 2016-04-26 DIAGNOSIS — K589 Irritable bowel syndrome without diarrhea: Secondary | ICD-10-CM

## 2016-04-26 DIAGNOSIS — Z923 Personal history of irradiation: Secondary | ICD-10-CM

## 2016-04-26 DIAGNOSIS — D472 Monoclonal gammopathy: Secondary | ICD-10-CM | POA: Diagnosis not present

## 2016-04-26 NOTE — Progress Notes (Signed)
Patient offers no complaints today. 

## 2016-05-03 ENCOUNTER — Other Ambulatory Visit: Payer: Self-pay | Admitting: *Deleted

## 2016-05-03 ENCOUNTER — Other Ambulatory Visit: Payer: Self-pay

## 2016-05-03 ENCOUNTER — Encounter: Payer: Self-pay | Admitting: Radiation Oncology

## 2016-05-03 ENCOUNTER — Ambulatory Visit
Admission: RE | Admit: 2016-05-03 | Discharge: 2016-05-03 | Disposition: A | Discharge status: Home / Self Care | Payer: Medicare Other | Source: Ambulatory Visit | Attending: Radiation Oncology | Admitting: Radiation Oncology

## 2016-05-03 ENCOUNTER — Inpatient Hospital Stay: Payer: Medicare Other | Attending: Radiation Oncology

## 2016-05-03 VITALS — BP 139/76 | HR 69 | Temp 97.0°F | Ht 70.0 in | Wt 199.5 lb

## 2016-05-03 DIAGNOSIS — C61 Malignant neoplasm of prostate: Secondary | ICD-10-CM

## 2016-05-03 DIAGNOSIS — Z923 Personal history of irradiation: Secondary | ICD-10-CM | POA: Insufficient documentation

## 2016-05-03 LAB — PSA: PSA: 0.15 ng/mL (ref 0.00–4.00)

## 2016-05-03 NOTE — Progress Notes (Signed)
Patient here for follow up. No changes since last appointment.  

## 2016-05-03 NOTE — Progress Notes (Signed)
Radiation Oncology Follow up Note  Name: Anthony Gregory   Date:   05/03/2016 MRN:  [id number] DOB: [birth year]    This 71 y.o. male presents to the clinic today for for your follow-up status post I-125 interstitial implant for Gleason 6 adenocarcinoma now out 4 years.  REFERRING PROVIDER: Adin Hector, MD  HPI: Patient is a 71 year old male now out 4 years having completed I-125 interstitial implant for Gleason 6 adenocarcinoma the prostate. He is seen today in routine follow-up and is doing well. He specifically denies any increased lower urinary tract symptoms diarrhea. His last PSA back in January 2018 was 0.33.Marland Kitchen  COMPLICATIONS OF TREATMENT: none  FOLLOW UP COMPLIANCE: keeps appointments   PHYSICAL EXAM:  BP 139/76 (BP Location: Right Arm)   Pulse 69   Temp 97 F (36.1 C) (Temporal)   Ht 5\' 10"  (1.778 m)   Wt 199 lb 8.3 oz (90.5 kg)   BMI 28.63 kg/m  On rectal exam rectal sphincter tone is good. Prostate is smooth contracted without evidence of nodularity or mass. Sulcus is preserved bilaterally. No discrete nodularity is identified. No other rectal abnormalities are noted. Well-developed well-nourished patient in NAD. HEENT reveals PERLA, EOMI, discs not visualized.  Oral cavity is clear. No oral mucosal lesions are identified. Neck is clear without evidence of cervical or supraclavicular adenopathy. Lungs are clear to A&P. Cardiac examination is essentially unremarkable with regular rate and rhythm without murmur rub or thrill. Abdomen is benign with no organomegaly or masses noted. Motor sensory and DTR levels are equal and symmetric in the upper and lower extremities. Cranial nerves II through XII are grossly intact. Proprioception is intact. No peripheral adenopathy or edema is identified. No motor or sensory levels are noted. Crude visual fields are within normal range.  RADIOLOGY RESULTS: No current films for review  PLAN: Present time he continues to do well under good  biochemical control of his prostate cancer. I'm please was overall progress. I've asked to see him back in 1 year and then will discontinue follow-up care. Patient knows to call at anytime with any concerns.  I would like to take this opportunity to thank you for allowing me to participate in the care of your patient.Armstead Peaks., MD

## 2017-03-13 DIAGNOSIS — D649 Anemia, unspecified: Secondary | ICD-10-CM | POA: Diagnosis not present

## 2017-03-13 DIAGNOSIS — C61 Malignant neoplasm of prostate: Secondary | ICD-10-CM | POA: Diagnosis not present

## 2017-03-13 DIAGNOSIS — D472 Monoclonal gammopathy: Secondary | ICD-10-CM | POA: Diagnosis not present

## 2017-03-13 DIAGNOSIS — E7800 Pure hypercholesterolemia, unspecified: Secondary | ICD-10-CM | POA: Diagnosis not present

## 2017-03-13 DIAGNOSIS — D696 Thrombocytopenia, unspecified: Secondary | ICD-10-CM | POA: Diagnosis not present

## 2017-03-20 DIAGNOSIS — K219 Gastro-esophageal reflux disease without esophagitis: Secondary | ICD-10-CM | POA: Diagnosis not present

## 2017-03-20 DIAGNOSIS — Z87898 Personal history of other specified conditions: Secondary | ICD-10-CM | POA: Diagnosis not present

## 2017-03-20 DIAGNOSIS — Z0000 Encounter for general adult medical examination without abnormal findings: Secondary | ICD-10-CM | POA: Diagnosis not present

## 2017-03-20 DIAGNOSIS — E7800 Pure hypercholesterolemia, unspecified: Secondary | ICD-10-CM | POA: Diagnosis not present

## 2017-03-20 DIAGNOSIS — D696 Thrombocytopenia, unspecified: Secondary | ICD-10-CM | POA: Diagnosis not present

## 2017-03-20 DIAGNOSIS — D649 Anemia, unspecified: Secondary | ICD-10-CM | POA: Diagnosis not present

## 2017-03-20 DIAGNOSIS — C61 Malignant neoplasm of prostate: Secondary | ICD-10-CM | POA: Diagnosis not present

## 2017-03-20 DIAGNOSIS — D472 Monoclonal gammopathy: Secondary | ICD-10-CM | POA: Diagnosis not present

## 2017-04-26 ENCOUNTER — Inpatient Hospital Stay: Payer: PPO | Attending: Oncology

## 2017-04-26 DIAGNOSIS — C61 Malignant neoplasm of prostate: Secondary | ICD-10-CM

## 2017-04-26 DIAGNOSIS — Z8546 Personal history of malignant neoplasm of prostate: Secondary | ICD-10-CM | POA: Diagnosis not present

## 2017-04-26 DIAGNOSIS — D472 Monoclonal gammopathy: Secondary | ICD-10-CM | POA: Diagnosis not present

## 2017-04-26 LAB — CBC WITH DIFFERENTIAL/PLATELET
BASOS PCT: 0 %
Basophils Absolute: 0 10*3/uL (ref 0–0.1)
EOS ABS: 0.1 10*3/uL (ref 0–0.7)
Eosinophils Relative: 2 %
HCT: 40.8 % (ref 40.0–52.0)
HEMOGLOBIN: 14.4 g/dL (ref 13.0–18.0)
LYMPHS ABS: 1 10*3/uL (ref 1.0–3.6)
Lymphocytes Relative: 22 %
MCH: 29.4 pg (ref 26.0–34.0)
MCHC: 35.4 g/dL (ref 32.0–36.0)
MCV: 83.2 fL (ref 80.0–100.0)
Monocytes Absolute: 0.2 10*3/uL (ref 0.2–1.0)
Monocytes Relative: 5 %
NEUTROS PCT: 71 %
Neutro Abs: 3.3 10*3/uL (ref 1.4–6.5)
Platelets: 138 10*3/uL — ABNORMAL LOW (ref 150–440)
RBC: 4.9 MIL/uL (ref 4.40–5.90)
RDW: 15.1 % — ABNORMAL HIGH (ref 11.5–14.5)
WBC: 4.7 10*3/uL (ref 3.8–10.6)

## 2017-04-26 LAB — COMPREHENSIVE METABOLIC PANEL
ALT: 19 U/L (ref 17–63)
ANION GAP: 7 (ref 5–15)
AST: 20 U/L (ref 15–41)
Albumin: 4.3 g/dL (ref 3.5–5.0)
Alkaline Phosphatase: 89 U/L (ref 38–126)
BILIRUBIN TOTAL: 0.8 mg/dL (ref 0.3–1.2)
BUN: 15 mg/dL (ref 6–20)
CHLORIDE: 105 mmol/L (ref 101–111)
CO2: 24 mmol/L (ref 22–32)
Calcium: 9.2 mg/dL (ref 8.9–10.3)
Creatinine, Ser: 1.14 mg/dL (ref 0.61–1.24)
GFR calc Af Amer: >60 mL/min (ref >60)
GFR calc non Af Amer: >60 mL/min (ref >60)
GLUCOSE: 106 mg/dL — AB (ref 65–99)
POTASSIUM: 4.1 mmol/L (ref 3.5–5.1)
SODIUM: 136 mmol/L (ref 135–145)
TOTAL PROTEIN: 7.5 g/dL (ref 6.5–8.1)

## 2017-04-26 LAB — PSA: Prostatic Specific Antigen: 0.14 ng/mL (ref 0.00–4.00)

## 2017-04-27 LAB — PROTEIN ELECTROPHORESIS, SERUM
A/G Ratio: 1.4 (ref 0.7–1.7)
ALPHA-2-GLOBULIN: 0.4 g/dL (ref 0.4–1.0)
Albumin ELP: 4 g/dL (ref 2.9–4.4)
Alpha-1-Globulin: 0.2 g/dL (ref 0.0–0.4)
BETA GLOBULIN: 1.5 g/dL — AB (ref 0.7–1.3)
Gamma Globulin: 0.7 g/dL (ref 0.4–1.8)
Globulin, Total: 2.8 g/dL (ref 2.2–3.9)
M-Spike, %: 0.4 g/dL — ABNORMAL HIGH
Total Protein ELP: 6.8 g/dL (ref 6.0–8.5)

## 2017-04-27 LAB — IGG, IGA, IGM
IGA: 627 mg/dL — AB (ref 61–437)
IGM (IMMUNOGLOBULIN M), SRM: 86 mg/dL (ref 20–172)
IgG (Immunoglobin G), Serum: 889 mg/dL (ref 700–1600)

## 2017-04-27 LAB — KAPPA/LAMBDA LIGHT CHAINS
KAPPA FREE LGHT CHN: 29.3 mg/L — AB (ref 3.3–19.4)
Kappa, lambda light chain ratio: 2.9 — ABNORMAL HIGH (ref 0.26–1.65)
LAMDA FREE LIGHT CHAINS: 10.1 mg/L (ref 5.7–26.3)

## 2017-04-29 NOTE — Progress Notes (Signed)
[place]  [phone] [phone]) [phone]  ID: Anthony Gregory OB: 08-28-1945  MR#: [id number]  IHK#:[id number]  Patient Care Team: Adin Hector, MD as PCP - General (Internal Medicine)  CHIEF COMPLAINT: MGUS, thrombocytopenia.  INTERVAL HISTORY: Patient returns to clinic today for routine yearly evaluation and discussion of his laboratory work.  He continues to feel well and is asymptomatic.  He denies any recent fevers or illnesses.  He has a good appetite and denies weight loss.  He has no bony pain.  He has no neurologic complaints.  He denies any easy bleeding or bruising.  He has no chest pain or shortness of breath.  He denies any nausea, vomiting, constipation, or diarrhea.  He has no melena or hematochezia.  He has no urinary complaints.  Patient offers no specific complaints today.   REVIEW OF SYSTEMS:   Review of Systems  Constitutional: Negative.  Negative for fever, malaise/fatigue and weight loss.  Respiratory: Negative.  Negative for cough and shortness of breath.   Cardiovascular: Negative.  Negative for chest pain and leg swelling.  Gastrointestinal: Negative.  Negative for abdominal pain.  Genitourinary: Negative.   Musculoskeletal: Negative.   Skin: Negative.  Negative for rash.  Neurological: Negative.  Negative for sensory change and weakness.  Psychiatric/Behavioral: Negative.  The patient is not nervous/anxious.     As per HPI. Otherwise, a complete review of systems is negative.  PAST MEDICAL HISTORY: Past Medical History:  Diagnosis Date  . Anemia   . GERD (gastroesophageal reflux disease)   . History of splenomegaly   . Irritable bowel syndrome   . Lipoma   . MGUS (monoclonal gammopathy of unknown significance)   . Prostate cancer ([place])   . Thrombocytopenia ([place])     PAST SURGICAL HISTORY: No past surgical history on file.  FAMILY HISTORY: Hypertension, hyperlipidemia.     ADVANCED DIRECTIVES:     HEALTH MAINTENANCE: Social History   Tobacco Use  . Smoking status: Former Research scientist (life sciences)  . Smokeless tobacco: Never Used  Substance Use Topics  . Alcohol use: No    Alcohol/week: 0.0 oz  . Drug use: No     Colonoscopy:  PAP:  Bone density:  Lipid panel:  No Known Allergies  Current Outpatient Medications  Medication Sig Dispense Refill  . aspirin EC 81 MG tablet Take 81 mg by mouth.    . esomeprazole (NEXIUM) 20 MG capsule Take 20 mg by mouth daily at 12 noon.     No current facility-administered medications for this visit.     OBJECTIVE: There were no vitals filed for this visit.   There is no height or weight on file to calculate BMI.    ECOG FS:0 - Asymptomatic  General: Well-developed, well-nourished, no acute distress. Eyes: Pink conjunctiva, anicteric sclera. Lungs: Clear to auscultation bilaterally. Heart: Regular rate and rhythm. No rubs, murmurs, or gallops. Abdomen: Soft, nontender, nondistended. No organomegaly noted, normoactive bowel sounds. Musculoskeletal: No edema, cyanosis, or clubbing. Neuro: Alert, answering all questions appropriately. Cranial nerves grossly intact. Skin: No rashes or petechiae noted. Psych: Normal affect.   LAB RESULTS:  Lab Results  Component Value Date   NA 136 04/26/2017   K 4.1 04/26/2017   CL 105 04/26/2017   CO2 24 04/26/2017   GLUCOSE 106 (H) 04/26/2017   BUN 15 04/26/2017   CREATININE 1.14 04/26/2017   CALCIUM 9.2 04/26/2017   PROT 7.5 04/26/2017   ALBUMIN 4.3 04/26/2017   AST 20 04/26/2017  ALT 19 04/26/2017   ALKPHOS 89 04/26/2017   BILITOT 0.8 04/26/2017   GFRNONAA >60 04/26/2017   GFRAA >60 04/26/2017    Lab Results  Component Value Date   WBC 4.7 04/26/2017   NEUTROABS 3.3 04/26/2017   HGB 14.4 04/26/2017   HCT 40.8 04/26/2017   MCV 83.2 04/26/2017   PLT 138 (L) 04/26/2017   Lab Results  Component Value Date   TOTALPROTELP 6.8 04/26/2017   ALBUMINELP 4.0 04/26/2017   A1GS 0.2 04/26/2017    A2GS 0.4 04/26/2017   BETS 1.5 (H) 04/26/2017   GAMS 0.7 04/26/2017   MSPIKE 0.4 (H) 04/26/2017   SPEI Comment 04/26/2017     STUDIES: No results found.  ASSESSMENT: MGUS, thrombocytopenia.  PLAN:    1.  MGUS: Patient most recent serum M spike has trended up and is now 0.4.  Previously, his M spike was unchanged at 0.1 since July 2016. He does not have a M spike in his urine. He has no evidence of end organ damage. Patient also had a metastatic bone survey in the past which was negative. This does not need to be repeated unless there is suspicion of progression of disease. Return to clinic in 6 months with repeat laboratory work and further evaluation.  If his M spike trends back down, he can possibly be switched back to yearly visits. 2.  Thrombocytopenia: Decreased but essentially unchanged.  Patient previously reported he has splenomegaly, but we do not have documentation of this. 3.  Anemia: Resolved.  Patient's hemoglobin and iron stores are within normal limits. 4.  History of prostate cancer: Patient has had XRT past.  Patient reports he has been discharged from radiation oncology.  PSA is 0.14.  Patient expressed understanding and was in agreement with this plan. He also understands that He can call clinic at any time with any questions, concerns, or complaints.    [name], MD   05/06/2017 7:30 AM

## 2017-05-03 ENCOUNTER — Encounter: Payer: Self-pay | Admitting: Radiation Oncology

## 2017-05-03 ENCOUNTER — Ambulatory Visit
Admission: RE | Admit: 2017-05-03 | Discharge: 2017-05-03 | Disposition: A | Discharge status: Home / Self Care | Payer: PPO | Source: Ambulatory Visit | Attending: Radiation Oncology | Admitting: Radiation Oncology

## 2017-05-03 ENCOUNTER — Other Ambulatory Visit: Payer: Self-pay

## 2017-05-03 ENCOUNTER — Inpatient Hospital Stay: Payer: PPO | Attending: Oncology | Admitting: Oncology

## 2017-05-03 VITALS — BP 147/82 | HR 68 | Temp 95.5°F | Resp 20 | Wt 202.5 lb

## 2017-05-03 DIAGNOSIS — C61 Malignant neoplasm of prostate: Secondary | ICD-10-CM | POA: Diagnosis not present

## 2017-05-03 DIAGNOSIS — D472 Monoclonal gammopathy: Secondary | ICD-10-CM | POA: Diagnosis not present

## 2017-05-03 DIAGNOSIS — D696 Thrombocytopenia, unspecified: Secondary | ICD-10-CM | POA: Diagnosis not present

## 2017-05-03 DIAGNOSIS — Z923 Personal history of irradiation: Secondary | ICD-10-CM | POA: Diagnosis not present

## 2017-05-03 DIAGNOSIS — Z8546 Personal history of malignant neoplasm of prostate: Secondary | ICD-10-CM

## 2017-05-03 NOTE — Progress Notes (Signed)
Here for follow up. Per pt stated doing well

## 2017-05-03 NOTE — Progress Notes (Signed)
Radiation Oncology Follow up Note  Name: Anthony Gregory   Date:   05/03/2017 MRN:  [id number] DOB: Jul 30, 1945    This 72 y.o. male presents to the clinic today for 5 year follow-up status post I-125 interstitial implant for Gleason 6 adenocarcinoma.  REFERRING PROVIDER: Adin Hector, MD  HPI: Patient is a 72 year old male now out 5 years having completed I-125 interstitial implant for Gleason 6 adenocarcinoma of the prostate. Seen today in routine follow-up he is doing well. He specifically denies diarrhea dysuria or any other GI/GU complaints. His most recent PSA was 0.14 in February..  COMPLICATIONS OF TREATMENT: none  FOLLOW UP COMPLIANCE: keeps appointments   PHYSICAL EXAM:  BP (!) 147/82   Pulse 68   Temp (!) 95.5 F (35.3 C)   Resp 20   Wt 202 lb 7.9 oz (91.8 kg)   BMI 29.05 kg/m  On rectal exam rectal sphincter tone is good. Prostate is smooth contracted without evidence of nodularity or mass. Sulcus is preserved bilaterally. No discrete nodularity is identified. No other rectal abnormalities are noted. Well-developed well-nourished patient in NAD. HEENT reveals PERLA, EOMI, discs not visualized.  Oral cavity is clear. No oral mucosal lesions are identified. Neck is clear without evidence of cervical or supraclavicular adenopathy. Lungs are clear to A&P. Cardiac examination is essentially unremarkable with regular rate and rhythm without murmur rub or thrill. Abdomen is benign with no organomegaly or masses noted. Motor sensory and DTR levels are equal and symmetric in the upper and lower extremities. Cranial nerves II through XII are grossly intact. Proprioception is intact. No peripheral adenopathy or edema is identified. No motor or sensory levels are noted. Crude visual fields are within normal range.  RADIOLOGY RESULTS: No current films for review  PLAN: Present time patient is under excellent biochemical control of his prostate cancer 5 years out. I'm going to turn  follow-up care over to his PMD. I would still perform a PSA yearly and reevaluate the patient should that be indicated. Patient is comfortable being followed by his PMD. Knows to call with any concerns.  I would like to take this opportunity to thank you for allowing me to participate in the care of your patient.Noreene Filbert, MD

## 2017-11-02 ENCOUNTER — Other Ambulatory Visit: Payer: PPO

## 2017-11-09 ENCOUNTER — Ambulatory Visit: Payer: PPO | Admitting: Oncology

## 2017-11-20 ENCOUNTER — Other Ambulatory Visit: Payer: Self-pay

## 2017-11-20 ENCOUNTER — Inpatient Hospital Stay: Payer: PPO | Attending: Oncology

## 2017-11-20 DIAGNOSIS — D472 Monoclonal gammopathy: Secondary | ICD-10-CM | POA: Diagnosis not present

## 2017-11-20 DIAGNOSIS — D696 Thrombocytopenia, unspecified: Secondary | ICD-10-CM | POA: Insufficient documentation

## 2017-11-20 DIAGNOSIS — Z8546 Personal history of malignant neoplasm of prostate: Secondary | ICD-10-CM | POA: Insufficient documentation

## 2017-11-20 DIAGNOSIS — Z87891 Personal history of nicotine dependence: Secondary | ICD-10-CM | POA: Insufficient documentation

## 2017-11-20 LAB — CBC WITH DIFFERENTIAL/PLATELET
BASOS ABS: 0 10*3/uL (ref 0–0.1)
Basophils Relative: 0 %
EOS PCT: 1 %
Eosinophils Absolute: 0 10*3/uL (ref 0–0.7)
HCT: 41.9 % (ref 40.0–52.0)
Hemoglobin: 14.8 g/dL (ref 13.0–18.0)
Lymphocytes Relative: 21 %
Lymphs Abs: 1.1 10*3/uL (ref 1.0–3.6)
MCH: 29.8 pg (ref 26.0–34.0)
MCHC: 35.3 g/dL (ref 32.0–36.0)
MCV: 84.4 fL (ref 80.0–100.0)
Monocytes Absolute: 0.2 10*3/uL (ref 0.2–1.0)
Monocytes Relative: 5 %
Neutro Abs: 3.9 10*3/uL (ref 1.4–6.5)
Neutrophils Relative %: 73 %
PLATELETS: 140 10*3/uL — AB (ref 150–440)
RBC: 4.97 MIL/uL (ref 4.40–5.90)
RDW: 15.3 % — AB (ref 11.5–14.5)
WBC: 5.4 10*3/uL (ref 3.8–10.6)

## 2017-11-20 LAB — COMPREHENSIVE METABOLIC PANEL
ALT: 19 U/L (ref 0–44)
AST: 20 U/L (ref 15–41)
Albumin: 4.5 g/dL (ref 3.5–5.0)
Alkaline Phosphatase: 89 U/L (ref 38–126)
Anion gap: 10 (ref 5–15)
BILIRUBIN TOTAL: 1.3 mg/dL — AB (ref 0.3–1.2)
BUN: 15 mg/dL (ref 8–23)
CO2: 23 mmol/L (ref 22–32)
CREATININE: 1.1 mg/dL (ref 0.61–1.24)
Calcium: 9.5 mg/dL (ref 8.9–10.3)
Chloride: 104 mmol/L (ref 98–111)
GFR calc Af Amer: >60 mL/min (ref >60)
Glucose, Bld: 111 mg/dL — ABNORMAL HIGH (ref 70–99)
Potassium: 4 mmol/L (ref 3.5–5.1)
Sodium: 137 mmol/L (ref 135–145)
TOTAL PROTEIN: 7.8 g/dL (ref 6.5–8.1)

## 2017-11-21 LAB — IGG, IGA, IGM
IGA: 642 mg/dL — AB (ref 61–437)
IGG (IMMUNOGLOBIN G), SERUM: 877 mg/dL (ref 700–1600)
IGM (IMMUNOGLOBULIN M), SRM: 96 mg/dL (ref 15–143)

## 2017-11-21 LAB — PROTEIN ELECTROPHORESIS, SERUM
A/G RATIO SPE: 1.4 (ref 0.7–1.7)
ALBUMIN ELP: 4.3 g/dL (ref 2.9–4.4)
ALPHA-1-GLOBULIN: 0.2 g/dL (ref 0.0–0.4)
ALPHA-2-GLOBULIN: 0.5 g/dL (ref 0.4–1.0)
Beta Globulin: 1.4 g/dL — ABNORMAL HIGH (ref 0.7–1.3)
GLOBULIN, TOTAL: 3.1 g/dL (ref 2.2–3.9)
Gamma Globulin: 0.9 g/dL (ref 0.4–1.8)
M-Spike, %: 0.5 g/dL — ABNORMAL HIGH
TOTAL PROTEIN ELP: 7.4 g/dL (ref 6.0–8.5)

## 2017-11-21 LAB — KAPPA/LAMBDA LIGHT CHAINS
KAPPA FREE LGHT CHN: 28.4 mg/L — AB (ref 3.3–19.4)
Kappa, lambda light chain ratio: 2.84 — ABNORMAL HIGH (ref 0.26–1.65)
LAMDA FREE LIGHT CHAINS: 10 mg/L (ref 5.7–26.3)

## 2017-11-26 NOTE — Progress Notes (Signed)
Anthony Gregory  [phone] Fax:(336) [phone]  ID: Anthony Gregory OB: [birth year]  MR#: [id number]  QTM#:[id number]  Patient Care Team: Adin Hector, MD as PCP - General (Internal Medicine)  CHIEF COMPLAINT: MGUS, thrombocytopenia.  INTERVAL HISTORY: Patient returns to clinic today for repeat laboratory work and further evaluation.  He continues to feel well and remains asymptomatic. He denies any recent fevers or illnesses.  He has a good appetite and denies weight loss.  He has no bony pain.  He has no neurologic complaints.  He denies any easy bleeding or bruising.  He has no chest pain or shortness of breath.  He denies any nausea, vomiting, constipation, or diarrhea.  He has no melena or hematochezia.  He has no urinary complaints.  Patient feels at his baseline offers no specific complaints today.   REVIEW OF SYSTEMS:   Review of Systems  Constitutional: Negative.  Negative for fever, malaise/fatigue and weight loss.  Respiratory: Negative.  Negative for cough and shortness of breath.   Cardiovascular: Negative.  Negative for chest pain and leg swelling.  Gastrointestinal: Negative.  Negative for abdominal pain.  Genitourinary: Negative.  Negative for dysuria and hematuria.  Musculoskeletal: Negative.  Negative for back pain.  Skin: Negative.  Negative for rash.  Neurological: Negative.  Negative for sensory change, focal weakness, weakness and headaches.  Psychiatric/Behavioral: Negative.  The patient is not nervous/anxious.     As per HPI. Otherwise, a complete review of systems is negative.  PAST MEDICAL HISTORY: Past Medical History:  Diagnosis Date  . Anemia   . GERD (gastroesophageal reflux disease)   . History of splenomegaly   . Irritable bowel syndrome   . Lipoma   . MGUS (monoclonal gammopathy of unknown significance)   . Prostate cancer ([place])   . Thrombocytopenia ([place])     PAST SURGICAL HISTORY: History reviewed. No  pertinent surgical history.  FAMILY HISTORY: Hypertension, hyperlipidemia.     ADVANCED DIRECTIVES:    HEALTH MAINTENANCE: Social History   Tobacco Use  . Smoking status: Former Research scientist (life sciences)  . Smokeless tobacco: Never Used  Substance Use Topics  . Alcohol use: No    Alcohol/week: 0.0 standard drinks  . Drug use: No     Colonoscopy:  PAP:  Bone density:  Lipid panel:  No Known Allergies  Current Outpatient Medications  Medication Sig Dispense Refill  . esomeprazole (NEXIUM) 20 MG capsule Take 20 mg by mouth daily at 12 noon.     No current facility-administered medications for this visit.     OBJECTIVE: Vitals:   11/27/17 1042 11/27/17 1048  BP:  (!) 158/86  Pulse:  69  Resp: 16   Temp:  98.3 F (36.8 C)     Body mass index is 27.32 kg/m.    ECOG FS:0 - Asymptomatic  General: Well-developed, well-nourished, no acute distress. Eyes: Pink conjunctiva, anicteric sclera. HEENT: Normocephalic, moist mucous membranes. Lungs: Clear to auscultation bilaterally. Heart: Regular rate and rhythm. No rubs, murmurs, or gallops. Abdomen: Soft, nontender, nondistended. No organomegaly noted, normoactive bowel sounds. Musculoskeletal: No edema, cyanosis, or clubbing. Neuro: Alert, answering all questions appropriately. Cranial nerves grossly intact. Skin: No rashes or petechiae noted. Psych: Normal affect.  LAB RESULTS:  Lab Results  Component Value Date   NA 137 11/20/2017   K 4.0 11/20/2017   CL 104 11/20/2017   CO2 23 11/20/2017   GLUCOSE 111 (H) 11/20/2017   BUN 15 11/20/2017   CREATININE 1.10 11/20/2017  CALCIUM 9.5 11/20/2017   PROT 7.8 11/20/2017   ALBUMIN 4.5 11/20/2017   AST 20 11/20/2017   ALT 19 11/20/2017   ALKPHOS 89 11/20/2017   BILITOT 1.3 (H) 11/20/2017   GFRNONAA >60 11/20/2017   GFRAA >60 11/20/2017    Lab Results  Component Value Date   WBC 5.4 11/20/2017   NEUTROABS 3.9 11/20/2017   HGB 14.8 11/20/2017   HCT 41.9 11/20/2017   MCV 84.4  11/20/2017   PLT 140 (L) 11/20/2017   Lab Results  Component Value Date   TOTALPROTELP 7.4 11/20/2017   ALBUMINELP 4.3 11/20/2017   A1GS 0.2 11/20/2017   A2GS 0.5 11/20/2017   BETS 1.4 (H) 11/20/2017   GAMS 0.9 11/20/2017   MSPIKE 0.5 (H) 11/20/2017   SPEI Comment 11/20/2017     STUDIES: No results found.  ASSESSMENT: MGUS, thrombocytopenia.  PLAN:    1.  MGUS: Patient's post recent M spike continues to slowly trend up from 0.4 six months ago to 0.5.  Prior to that, his M spike was unchanged at 0.1 since at least July 2016. He does not have a M spike in his urine. He has no evidence of end organ damage.  Patient had a metastatic bone survey on May 02, 2011 reported as normal.  This does not need to be repeated unless there is suspicion of progression of disease.  No intervention is needed at this time.  Return to clinic in 6 months with repeat laboratory can further evaluation.  If his M spike remains stable, can consider transitioning back to yearly evaluation.   2.  Thrombocytopenia: Stable and unchanged.  Patient previously reported he has splenomegaly, but we do not have documentation of this. 3.  Anemia: Resolved.  Patient's hemoglobin and iron stores are within normal limits. 4.  History of prostate cancer: Patient has had XRT past.  Patient reports he has been discharged from radiation oncology.  Patient's last PSA on April 26, 2017 was reported 0.14.    Patient expressed understanding and was in agreement with this plan. He also understands that He can call clinic at any time with any questions, concerns, or complaints.    [name], MD   11/30/2017 12:24 PM

## 2017-11-27 ENCOUNTER — Other Ambulatory Visit: Payer: Self-pay

## 2017-11-27 ENCOUNTER — Encounter: Payer: Self-pay | Admitting: Oncology

## 2017-11-27 ENCOUNTER — Inpatient Hospital Stay (HOSPITAL_BASED_OUTPATIENT_CLINIC_OR_DEPARTMENT_OTHER): Payer: PPO | Admitting: Oncology

## 2017-11-27 VITALS — BP 158/86 | HR 69 | Temp 98.3°F | Resp 16 | Ht 70.0 in | Wt 190.4 lb

## 2017-11-27 DIAGNOSIS — D472 Monoclonal gammopathy: Secondary | ICD-10-CM

## 2017-11-27 DIAGNOSIS — D696 Thrombocytopenia, unspecified: Secondary | ICD-10-CM

## 2017-11-27 DIAGNOSIS — Z87891 Personal history of nicotine dependence: Secondary | ICD-10-CM

## 2017-11-27 DIAGNOSIS — Z8546 Personal history of malignant neoplasm of prostate: Secondary | ICD-10-CM

## 2017-11-27 NOTE — Progress Notes (Signed)
Patient here for follow up no changes since last appointment 

## 2018-03-14 DIAGNOSIS — D649 Anemia, unspecified: Secondary | ICD-10-CM | POA: Diagnosis not present

## 2018-03-14 DIAGNOSIS — D696 Thrombocytopenia, unspecified: Secondary | ICD-10-CM | POA: Diagnosis not present

## 2018-03-14 DIAGNOSIS — E7800 Pure hypercholesterolemia, unspecified: Secondary | ICD-10-CM | POA: Diagnosis not present

## 2018-03-14 DIAGNOSIS — E781 Pure hyperglyceridemia: Secondary | ICD-10-CM | POA: Diagnosis not present

## 2018-03-21 DIAGNOSIS — D472 Monoclonal gammopathy: Secondary | ICD-10-CM | POA: Diagnosis not present

## 2018-03-21 DIAGNOSIS — Z0000 Encounter for general adult medical examination without abnormal findings: Secondary | ICD-10-CM | POA: Diagnosis not present

## 2018-03-21 DIAGNOSIS — K219 Gastro-esophageal reflux disease without esophagitis: Secondary | ICD-10-CM | POA: Diagnosis not present

## 2018-03-21 DIAGNOSIS — C61 Malignant neoplasm of prostate: Secondary | ICD-10-CM | POA: Diagnosis not present

## 2018-03-21 DIAGNOSIS — E7800 Pure hypercholesterolemia, unspecified: Secondary | ICD-10-CM | POA: Diagnosis not present

## 2018-03-21 DIAGNOSIS — D649 Anemia, unspecified: Secondary | ICD-10-CM | POA: Diagnosis not present

## 2018-03-21 DIAGNOSIS — D696 Thrombocytopenia, unspecified: Secondary | ICD-10-CM | POA: Diagnosis not present

## 2018-05-21 ENCOUNTER — Inpatient Hospital Stay: Payer: PPO | Attending: Oncology

## 2018-05-28 ENCOUNTER — Inpatient Hospital Stay: Payer: PPO | Admitting: Oncology

## 2018-06-19 ENCOUNTER — Other Ambulatory Visit: Payer: Self-pay

## 2018-06-19 DIAGNOSIS — D472 Monoclonal gammopathy: Secondary | ICD-10-CM

## 2018-06-20 ENCOUNTER — Inpatient Hospital Stay: Payer: PPO | Attending: Oncology

## 2018-06-20 ENCOUNTER — Other Ambulatory Visit: Payer: Self-pay

## 2018-06-20 DIAGNOSIS — D472 Monoclonal gammopathy: Secondary | ICD-10-CM | POA: Insufficient documentation

## 2018-06-20 LAB — COMPREHENSIVE METABOLIC PANEL
ALT: 17 U/L (ref 0–44)
AST: 18 U/L (ref 15–41)
Albumin: 4.2 g/dL (ref 3.5–5.0)
Alkaline Phosphatase: 94 U/L (ref 38–126)
Anion gap: 4 — ABNORMAL LOW (ref 5–15)
BUN: 17 mg/dL (ref 8–23)
CO2: 28 mmol/L (ref 22–32)
Calcium: 9 mg/dL (ref 8.9–10.3)
Chloride: 106 mmol/L (ref 98–111)
Creatinine, Ser: 1.1 mg/dL (ref 0.61–1.24)
GFR calc Af Amer: >60 mL/min (ref >60)
GFR calc non Af Amer: >60 mL/min (ref >60)
Glucose, Bld: 99 mg/dL (ref 70–99)
Potassium: 4 mmol/L (ref 3.5–5.1)
Sodium: 138 mmol/L (ref 135–145)
Total Bilirubin: 0.9 mg/dL (ref 0.3–1.2)
Total Protein: 7.1 g/dL (ref 6.5–8.1)

## 2018-06-20 LAB — CBC WITH DIFFERENTIAL/PLATELET
Abs Immature Granulocytes: 0.02 10*3/uL (ref 0.00–0.07)
Basophils Absolute: 0 10*3/uL (ref 0.0–0.1)
Basophils Relative: 0 %
Eosinophils Absolute: 0.1 10*3/uL (ref 0.0–0.5)
Eosinophils Relative: 2 %
HCT: 39 % (ref 39.0–52.0)
Hemoglobin: 13.7 g/dL (ref 13.0–17.0)
Immature Granulocytes: 0 %
Lymphocytes Relative: 34 %
Lymphs Abs: 1.5 10*3/uL (ref 0.7–4.0)
MCH: 29.3 pg (ref 26.0–34.0)
MCHC: 35.1 g/dL (ref 30.0–36.0)
MCV: 83.5 fL (ref 80.0–100.0)
Monocytes Absolute: 0.4 10*3/uL (ref 0.1–1.0)
Monocytes Relative: 8 %
Neutro Abs: 2.6 10*3/uL (ref 1.7–7.7)
Neutrophils Relative %: 56 %
Platelets: 124 10*3/uL — ABNORMAL LOW (ref 150–400)
RBC: 4.67 MIL/uL (ref 4.22–5.81)
RDW: 15.3 % (ref 11.5–15.5)
WBC: 4.6 10*3/uL (ref 4.0–10.5)
nRBC: 0 % (ref 0.0–0.2)

## 2018-06-21 LAB — IGG, IGA, IGM
IgA: 596 mg/dL — ABNORMAL HIGH (ref 61–437)
IgG (Immunoglobin G), Serum: 850 mg/dL (ref 603–1613)
IgM (Immunoglobulin M), Srm: 87 mg/dL (ref 15–143)

## 2018-06-21 LAB — KAPPA/LAMBDA LIGHT CHAINS
Kappa free light chain: 28.1 mg/L — ABNORMAL HIGH (ref 3.3–19.4)
Kappa, lambda light chain ratio: 3.05 — ABNORMAL HIGH (ref 0.26–1.65)
Lambda free light chains: 9.2 mg/L (ref 5.7–26.3)

## 2018-06-21 LAB — PROTEIN ELECTROPHORESIS, SERUM
A/G Ratio: 1.5 (ref 0.7–1.7)
Albumin ELP: 4.1 g/dL (ref 2.9–4.4)
Alpha-1-Globulin: 0.2 g/dL (ref 0.0–0.4)
Alpha-2-Globulin: 0.4 g/dL (ref 0.4–1.0)
Beta Globulin: 1.3 g/dL (ref 0.7–1.3)
Gamma Globulin: 0.8 g/dL (ref 0.4–1.8)
Globulin, Total: 2.8 g/dL (ref 2.2–3.9)
M-Spike, %: 0.1 g/dL — ABNORMAL HIGH
Total Protein ELP: 6.9 g/dL (ref 6.0–8.5)

## 2018-06-27 ENCOUNTER — Inpatient Hospital Stay: Payer: PPO | Admitting: Oncology

## 2018-11-24 ENCOUNTER — Other Ambulatory Visit: Payer: Self-pay

## 2018-11-24 DIAGNOSIS — Z20822 Contact with and (suspected) exposure to covid-19: Secondary | ICD-10-CM

## 2018-11-25 LAB — NOVEL CORONAVIRUS, NAA: SARS-CoV-2, NAA: NOT DETECTED

## 2019-02-21 ENCOUNTER — Encounter
Admission: EM | Disposition: A | Discharge status: Home / Self Care | Payer: Self-pay | Source: Home / Self Care | Attending: Emergency Medicine

## 2019-02-21 ENCOUNTER — Emergency Department: Payer: PPO

## 2019-02-21 ENCOUNTER — Other Ambulatory Visit: Payer: Self-pay

## 2019-02-21 ENCOUNTER — Observation Stay
Admission: EM | Admit: 2019-02-21 | Discharge: 2019-02-22 | Disposition: A | Discharge status: Home / Self Care | Payer: PPO | Attending: General Surgery | Admitting: General Surgery

## 2019-02-21 ENCOUNTER — Encounter: Payer: Self-pay | Admitting: Emergency Medicine

## 2019-02-21 ENCOUNTER — Emergency Department: Payer: PPO | Admitting: Anesthesiology

## 2019-02-21 DIAGNOSIS — R109 Unspecified abdominal pain: Secondary | ICD-10-CM | POA: Diagnosis not present

## 2019-02-21 DIAGNOSIS — Z8546 Personal history of malignant neoplasm of prostate: Secondary | ICD-10-CM | POA: Insufficient documentation

## 2019-02-21 DIAGNOSIS — K3580 Unspecified acute appendicitis: Secondary | ICD-10-CM | POA: Diagnosis present

## 2019-02-21 DIAGNOSIS — K381 Appendicular concretions: Secondary | ICD-10-CM | POA: Insufficient documentation

## 2019-02-21 DIAGNOSIS — K37 Unspecified appendicitis: Principal | ICD-10-CM | POA: Insufficient documentation

## 2019-02-21 DIAGNOSIS — K3589 Other acute appendicitis without perforation or gangrene: Secondary | ICD-10-CM | POA: Diagnosis not present

## 2019-02-21 DIAGNOSIS — Z20828 Contact with and (suspected) exposure to other viral communicable diseases: Secondary | ICD-10-CM | POA: Diagnosis not present

## 2019-02-21 DIAGNOSIS — K429 Umbilical hernia without obstruction or gangrene: Secondary | ICD-10-CM | POA: Diagnosis not present

## 2019-02-21 DIAGNOSIS — Z87891 Personal history of nicotine dependence: Secondary | ICD-10-CM | POA: Diagnosis not present

## 2019-02-21 DIAGNOSIS — Z03818 Encounter for observation for suspected exposure to other biological agents ruled out: Secondary | ICD-10-CM | POA: Diagnosis not present

## 2019-02-21 HISTORY — PX: LAPAROSCOPIC APPENDECTOMY: SHX408

## 2019-02-21 LAB — COMPREHENSIVE METABOLIC PANEL
ALT: 17 U/L (ref 0–44)
AST: 16 U/L (ref 15–41)
Albumin: 4.4 g/dL (ref 3.5–5.0)
Alkaline Phosphatase: 96 U/L (ref 38–126)
Anion gap: 10 (ref 5–15)
BUN: 15 mg/dL (ref 8–23)
CO2: 24 mmol/L (ref 22–32)
Calcium: 8.9 mg/dL (ref 8.9–10.3)
Chloride: 104 mmol/L (ref 98–111)
Creatinine, Ser: 0.99 mg/dL (ref 0.61–1.24)
GFR calc Af Amer: >60 mL/min (ref >60)
GFR calc non Af Amer: >60 mL/min (ref >60)
Glucose, Bld: 110 mg/dL — ABNORMAL HIGH (ref 70–99)
Potassium: 3.8 mmol/L (ref 3.5–5.1)
Sodium: 138 mmol/L (ref 135–145)
Total Bilirubin: 1.4 mg/dL — ABNORMAL HIGH (ref 0.3–1.2)
Total Protein: 7.6 g/dL (ref 6.5–8.1)

## 2019-02-21 LAB — CBC
HCT: 41.6 % (ref 39.0–52.0)
Hemoglobin: 14.7 g/dL (ref 13.0–17.0)
MCH: 29.3 pg (ref 26.0–34.0)
MCHC: 35.3 g/dL (ref 30.0–36.0)
MCV: 82.9 fL (ref 80.0–100.0)
Platelets: 134 10*3/uL — ABNORMAL LOW (ref 150–400)
RBC: 5.02 MIL/uL (ref 4.22–5.81)
RDW: 14.3 % (ref 11.5–15.5)
WBC: 9.8 10*3/uL (ref 4.0–10.5)
nRBC: 0 % (ref 0.0–0.2)

## 2019-02-21 LAB — URINALYSIS, COMPLETE (UACMP) WITH MICROSCOPIC
Bacteria, UA: NONE SEEN
Bilirubin Urine: NEGATIVE
Glucose, UA: NEGATIVE mg/dL
Hgb urine dipstick: NEGATIVE
Ketones, ur: 20 mg/dL — AB
Leukocytes,Ua: NEGATIVE
Nitrite: NEGATIVE
Protein, ur: NEGATIVE mg/dL
Specific Gravity, Urine: >1.046 — ABNORMAL HIGH (ref 1.005–1.030)
Squamous Epithelial / HPF: NONE SEEN (ref 0–5)
pH: 6 (ref 5.0–8.0)

## 2019-02-21 LAB — LIPASE, BLOOD: Lipase: 24 U/L (ref 11–51)

## 2019-02-21 LAB — POC SARS CORONAVIRUS 2 AG: SARS Coronavirus 2 Ag: NEGATIVE

## 2019-02-21 SURGERY — APPENDECTOMY, LAPAROSCOPIC
Anesthesia: General

## 2019-02-21 MED ORDER — LIDOCAINE HCL (CARDIAC) PF 100 MG/5ML IV SOSY
PREFILLED_SYRINGE | INTRAVENOUS | Status: DC | PRN
  Administered 2019-02-21: 100 mg via INTRAVENOUS

## 2019-02-21 MED ORDER — ROCURONIUM BROMIDE 50 MG/5ML IV SOLN
INTRAVENOUS | Status: AC
  Filled 2019-02-21: qty 1

## 2019-02-21 MED ORDER — SODIUM CHLORIDE 0.9 % IV SOLN: INTRAVENOUS | Status: DC

## 2019-02-21 MED ORDER — MORPHINE SULFATE (PF) 4 MG/ML IV SOLN
4.0000 mg | Freq: Once | INTRAVENOUS | Status: AC
  Administered 2019-02-21: 4 mg via INTRAVENOUS
  Filled 2019-02-21: qty 1

## 2019-02-21 MED ORDER — SUGAMMADEX SODIUM 200 MG/2ML IV SOLN
INTRAVENOUS | Status: AC
  Filled 2019-02-21: qty 2

## 2019-02-21 MED ORDER — SUCCINYLCHOLINE CHLORIDE 20 MG/ML IJ SOLN
INTRAMUSCULAR | Status: DC | PRN
  Administered 2019-02-21: 120 mg via INTRAVENOUS

## 2019-02-21 MED ORDER — ACETAMINOPHEN 10 MG/ML IV SOLN
INTRAVENOUS | Status: AC
  Filled 2019-02-21: qty 100

## 2019-02-21 MED ORDER — PROPOFOL 10 MG/ML IV BOLUS
INTRAVENOUS | Status: DC | PRN
  Administered 2019-02-21: 150 mg via INTRAVENOUS

## 2019-02-21 MED ORDER — SIMETHICONE 80 MG PO CHEW: 40.0000 mg | CHEWABLE_TABLET | Freq: Four times a day (QID) | ORAL | Status: DC | PRN

## 2019-02-21 MED ORDER — SUGAMMADEX SODIUM 200 MG/2ML IV SOLN
INTRAVENOUS | Status: DC | PRN
  Administered 2019-02-21: 200 mg via INTRAVENOUS

## 2019-02-21 MED ORDER — LACTATED RINGERS IV SOLN: INTRAVENOUS | Status: DC

## 2019-02-21 MED ORDER — PIPERACILLIN-TAZOBACTAM 3.375 G IVPB
3.3750 g | Freq: Three times a day (TID) | INTRAVENOUS | Status: DC
  Administered 2019-02-21: 3.375 g via INTRAVENOUS

## 2019-02-21 MED ORDER — ONDANSETRON 4 MG PO TBDP: 4.0000 mg | ORAL_TABLET | Freq: Four times a day (QID) | ORAL | Status: DC | PRN

## 2019-02-21 MED ORDER — ONDANSETRON HCL 4 MG/2ML IJ SOLN: 4.0000 mg | Freq: Once | INTRAMUSCULAR | Status: DC | PRN

## 2019-02-21 MED ORDER — IOHEXOL 300 MG/ML  SOLN
100.0000 mL | Freq: Once | INTRAMUSCULAR | Status: AC | PRN
  Administered 2019-02-21: 10:00:00 100 mL via INTRAVENOUS

## 2019-02-21 MED ORDER — TRAMADOL HCL 50 MG PO TABS: 50.0000 mg | ORAL_TABLET | Freq: Four times a day (QID) | ORAL | Status: DC | PRN

## 2019-02-21 MED ORDER — IBUPROFEN 600 MG PO TABS
600.0000 mg | ORAL_TABLET | Freq: Four times a day (QID) | ORAL | Status: DC | PRN
  Filled 2019-02-21: qty 1

## 2019-02-21 MED ORDER — ACETAMINOPHEN 500 MG PO TABS
1000.0000 mg | ORAL_TABLET | Freq: Four times a day (QID) | ORAL | Status: DC
  Administered 2019-02-21 – 2019-02-22 (×4): 1000 mg via ORAL
  Filled 2019-02-21 (×4): qty 2

## 2019-02-21 MED ORDER — DEXAMETHASONE SODIUM PHOSPHATE 10 MG/ML IJ SOLN
INTRAMUSCULAR | Status: DC | PRN
  Administered 2019-02-21: 10 mg via INTRAVENOUS

## 2019-02-21 MED ORDER — ONDANSETRON HCL 4 MG/2ML IJ SOLN
INTRAMUSCULAR | Status: DC | PRN
  Administered 2019-02-21: 4 mg via INTRAVENOUS

## 2019-02-21 MED ORDER — ONDANSETRON HCL 4 MG/2ML IJ SOLN: 4.0000 mg | Freq: Four times a day (QID) | INTRAMUSCULAR | Status: DC | PRN

## 2019-02-21 MED ORDER — ROCURONIUM BROMIDE 100 MG/10ML IV SOLN
INTRAVENOUS | Status: DC | PRN
  Administered 2019-02-21: 10 mg via INTRAVENOUS
  Administered 2019-02-21: 20 mg via INTRAVENOUS
  Administered 2019-02-21: 40 mg via INTRAVENOUS

## 2019-02-21 MED ORDER — DEXAMETHASONE SODIUM PHOSPHATE 10 MG/ML IJ SOLN
INTRAMUSCULAR | Status: AC
  Filled 2019-02-21: qty 1

## 2019-02-21 MED ORDER — DEXMEDETOMIDINE HCL 200 MCG/2ML IV SOLN
INTRAVENOUS | Status: DC | PRN
  Administered 2019-02-21 (×2): 8 ug via INTRAVENOUS

## 2019-02-21 MED ORDER — FENTANYL CITRATE (PF) 100 MCG/2ML IJ SOLN
INTRAMUSCULAR | Status: AC
  Filled 2019-02-21: qty 2

## 2019-02-21 MED ORDER — SODIUM CHLORIDE 0.9 % IR SOLN
Status: DC | PRN
  Administered 2019-02-21: 500 mL

## 2019-02-21 MED ORDER — FENTANYL CITRATE (PF) 100 MCG/2ML IJ SOLN
INTRAMUSCULAR | Status: DC | PRN
  Administered 2019-02-21 (×2): 25 ug via INTRAVENOUS
  Administered 2019-02-21 (×3): 50 ug via INTRAVENOUS

## 2019-02-21 MED ORDER — PROPOFOL 10 MG/ML IV BOLUS
INTRAVENOUS | Status: AC
  Filled 2019-02-21: qty 20

## 2019-02-21 MED ORDER — ONDANSETRON HCL 4 MG/2ML IJ SOLN
4.0000 mg | Freq: Once | INTRAMUSCULAR | Status: AC
  Administered 2019-02-21: 4 mg via INTRAVENOUS
  Filled 2019-02-21: qty 2

## 2019-02-21 MED ORDER — PHENYLEPHRINE HCL (PRESSORS) 10 MG/ML IV SOLN
INTRAVENOUS | Status: DC | PRN
  Administered 2019-02-21 (×2): 100 ug via INTRAVENOUS

## 2019-02-21 MED ORDER — ACETAMINOPHEN 10 MG/ML IV SOLN
INTRAVENOUS | Status: DC | PRN
  Administered 2019-02-21: 1000 mg via INTRAVENOUS

## 2019-02-21 MED ORDER — FENTANYL CITRATE (PF) 100 MCG/2ML IJ SOLN: 25.0000 ug | INTRAMUSCULAR | Status: DC | PRN

## 2019-02-21 MED ORDER — LIDOCAINE-EPINEPHRINE 1 %-1:100000 IJ SOLN
INTRAMUSCULAR | Status: DC | PRN
  Administered 2019-02-21: 9 mL
  Administered 2019-02-21: 16 mL

## 2019-02-21 MED ORDER — ONDANSETRON HCL 4 MG/2ML IJ SOLN
INTRAMUSCULAR | Status: AC
  Filled 2019-02-21: qty 2

## 2019-02-21 MED ORDER — SODIUM CHLORIDE 0.9% FLUSH: 3.0000 mL | Freq: Once | INTRAVENOUS | Status: DC

## 2019-02-21 MED ORDER — KETOROLAC TROMETHAMINE 15 MG/ML IJ SOLN
15.0000 mg | Freq: Four times a day (QID) | INTRAMUSCULAR | Status: DC | PRN
  Administered 2019-02-21 – 2019-02-22 (×2): 15 mg via INTRAVENOUS
  Filled 2019-02-21 (×2): qty 1

## 2019-02-21 MED ORDER — HYDROMORPHONE HCL 1 MG/ML IJ SOLN: 0.5000 mg | INTRAMUSCULAR | Status: DC | PRN

## 2019-02-21 MED ORDER — PIPERACILLIN-TAZOBACTAM 3.375 G IVPB
INTRAVENOUS | Status: AC
  Filled 2019-02-21: qty 50

## 2019-02-21 SURGICAL SUPPLY — 51 items
APPLICATOR COTTON TIP 6 STRL (MISCELLANEOUS) IMPLANT
APPLICATOR COTTON TIP 6IN STRL (MISCELLANEOUS)
APPLIER CLIP 5 13 M/L LIGAMAX5 (MISCELLANEOUS)
BLADE SURG SZ11 CARB STEEL (BLADE) ×2 IMPLANT
CANISTER SUCT 1200ML W/VALVE (MISCELLANEOUS) ×2 IMPLANT
CHLORAPREP W/TINT 26 (MISCELLANEOUS) ×2 IMPLANT
CLIP APPLIE 5 13 M/L LIGAMAX5 (MISCELLANEOUS) IMPLANT
COVER WAND RF STERILE (DRAPES) ×2 IMPLANT
CUTTER FLEX LINEAR 45M (STAPLE) ×2 IMPLANT
DEFOGGER SCOPE WARMER CLEARIFY (MISCELLANEOUS) ×2 IMPLANT
DERMABOND ADVANCED (GAUZE/BANDAGES/DRESSINGS) ×1
DERMABOND ADVANCED .7 DNX12 (GAUZE/BANDAGES/DRESSINGS) ×1 IMPLANT
ELECT CAUTERY BLADE 6.4 (BLADE) ×2 IMPLANT
ELECT CAUTERY BLADE TIP 2.5 (TIP) ×2
ELECT REM PT RETURN 9FT ADLT (ELECTROSURGICAL) ×2
ELECTRODE CAUTERY BLDE TIP 2.5 (TIP) ×1 IMPLANT
ELECTRODE REM PT RTRN 9FT ADLT (ELECTROSURGICAL) ×1 IMPLANT
GLOVE BIO SURGEON STRL SZ 6.5 (GLOVE) ×2 IMPLANT
GLOVE INDICATOR 7.0 STRL GRN (GLOVE) ×4 IMPLANT
GOWN STRL REUS W/ TWL LRG LVL3 (GOWN DISPOSABLE) ×2 IMPLANT
GOWN STRL REUS W/TWL LRG LVL3 (GOWN DISPOSABLE) ×2
GRASPER SUT TROCAR 14GX15 (MISCELLANEOUS) ×2 IMPLANT
IRRIGATION STRYKERFLOW (MISCELLANEOUS) ×1 IMPLANT
IRRIGATOR STRYKERFLOW (MISCELLANEOUS) ×2
IV NS 1000ML (IV SOLUTION) ×1
IV NS 1000ML BAXH (IV SOLUTION) ×1 IMPLANT
KIT TURNOVER KIT A (KITS) ×2 IMPLANT
KITTNER LAPARASCOPIC 5X40 (MISCELLANEOUS) ×2 IMPLANT
LABEL OR SOLS (LABEL) ×2 IMPLANT
LIGASURE LAP MARYLAND 5MM 37CM (ELECTROSURGICAL) IMPLANT
NEEDLE HYPO 22GX1.5 SAFETY (NEEDLE) ×2 IMPLANT
NS IRRIG 500ML POUR BTL (IV SOLUTION) ×2 IMPLANT
PACK LAP CHOLECYSTECTOMY (MISCELLANEOUS) ×2 IMPLANT
PENCIL ELECTRO HAND CTR (MISCELLANEOUS) ×2 IMPLANT
POUCH SPECIMEN RETRIEVAL 10MM (ENDOMECHANICALS) ×2 IMPLANT
RELOAD STAPLE TA45 3.5 REG BLU (ENDOMECHANICALS) ×2 IMPLANT
SCISSORS METZENBAUM CVD 33 (INSTRUMENTS) ×2 IMPLANT
SET TUBE SMOKE EVAC HIGH FLOW (TUBING) ×2 IMPLANT
SHEARS HARMONIC ACE PLUS 36CM (ENDOMECHANICALS) ×2 IMPLANT
SLEEVE ADV FIXATION 5X100MM (TROCAR) ×4 IMPLANT
STRIP CLOSURE SKIN 1/2X4 (GAUZE/BANDAGES/DRESSINGS) ×2 IMPLANT
SUT MNCRL 4-0 (SUTURE) ×1
SUT MNCRL 4-0 27XMFL (SUTURE) ×1
SUT VIC AB 3-0 SH 27 (SUTURE) ×1
SUT VIC AB 3-0 SH 27X BRD (SUTURE) ×1 IMPLANT
SUT VICRYL 0 AB UR-6 (SUTURE) ×4 IMPLANT
SUTURE MNCRL 4-0 27XMF (SUTURE) ×1 IMPLANT
TRAY FOLEY MTR SLVR 16FR STAT (SET/KITS/TRAYS/PACK) ×2 IMPLANT
TROCAR 130MM GELPORT  DAV (MISCELLANEOUS) ×2 IMPLANT
TROCAR ADV FIXATION 12X100MM (TROCAR) IMPLANT
TROCAR Z-THREAD OPTICAL 5X100M (TROCAR) ×2 IMPLANT

## 2019-02-21 NOTE — Anesthesia Post-op Follow-up Note (Signed)
Anesthesia QCDR form completed.        

## 2019-02-21 NOTE — Anesthesia Procedure Notes (Signed)
Procedure Name: Intubation Date/Time: 02/21/2019 12:57 PM Performed by: Caryl Asp, CRNA Pre-anesthesia Checklist: Patient identified, Patient being monitored, Timeout performed, Emergency Drugs available and Suction available Patient Re-evaluated:Patient Re-evaluated prior to induction Oxygen Delivery Method: Circle system utilized Preoxygenation: Pre-oxygenation with 100% oxygen Induction Type: IV induction Ventilation: Mask ventilation with difficulty and Oral airway inserted - appropriate to patient size Laryngoscope Size: McGraph and 4 Grade View: Grade I Tube type: Oral Tube size: 7.0 mm Number of attempts: 1 Airway Equipment and Method: Stylet Placement Confirmation: ETT inserted through vocal cords under direct vision,  positive ETCO2 and breath sounds checked- equal and bilateral Secured at: 22 cm Tube secured with: Tape Dental Injury: Teeth and Oropharynx as per pre-operative assessment

## 2019-02-21 NOTE — Anesthesia Preprocedure Evaluation (Signed)
Anesthesia Evaluation  Patient identified by MRN, date of birth, ID band Patient awake    Reviewed: Allergy & Precautions, NPO status , Patient's Chart, lab work & pertinent test results  Airway Mallampati: II  TM Distance: >3 FB     Dental   Pulmonary former smoker,    Pulmonary exam normal        Cardiovascular negative cardio ROS Normal cardiovascular exam     Neuro/Psych negative neurological ROS  negative psych ROS   GI/Hepatic Neg liver ROS, GERD  ,  Endo/Other  negative endocrine ROS  Renal/GU negative Renal ROS  negative genitourinary   Musculoskeletal negative musculoskeletal ROS (+)   Abdominal Normal abdominal exam  (+)   Peds negative pediatric ROS (+)  Hematology  (+) anemia ,   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) No date: Prostate cancer ([place]) No date: Thrombocytopenia ([place])  Reproductive/Obstetrics                             Anesthesia Physical Anesthesia Plan  ASA: II and emergent  Anesthesia Plan: General   Post-op Pain Management:    Induction: Intravenous  PONV Risk Score and Plan:   Airway Management Planned: Oral ETT  Additional Equipment:   Intra-op Plan:   Post-operative Plan: Extubation in OR  Informed Consent: I have reviewed the patients History and Physical, chart, labs and discussed the procedure including the risks, benefits and alternatives for the proposed anesthesia with the patient or authorized representative who has indicated his/her understanding and acceptance.     Dental advisory given  Plan Discussed with: CRNA and Surgeon  Anesthesia Plan Comments:         Anesthesia Quick Evaluation

## 2019-02-21 NOTE — ED Triage Notes (Signed)
abd pain x2 days , N/V.

## 2019-02-21 NOTE — Op Note (Signed)
Operative Note  Laparoscopic Appendectomy and Open Umbilical Hernia Repair  Anthony Gregory Date of operation:  02/21/2019  Indications: The patient presented with a history of  abdominal pain. Workup has revealed findings consistent with acute appendicitis.  Physical examination also revealed an umbilical hernia.  Pre-operative Diagnosis: Acute appendicitis without mention of peritonitis; umbilical hernia  Post-operative Diagnosis: Same  Surgeon: Fredirick Maudlin, MD  Anesthesia: GETA  Findings: Small umbilical hernia containing fat only.  Inflamed appendix consistent with acute appendicitis.  No evidence of perforation.  No abscess.  Estimated Blood Loss: Less than 5 cc         Specimens: appendix         Complications: None immediately apparent  Procedure Details  The patient was seen again in the preop area. The options of surgery versus observation were reviewed with the patient and/or family. The risks of bleeding, infection, recurrence of symptoms, negative laparoscopy, potential for an open procedure, bowel injury, abscess or infection, were all reviewed as well. The patient was taken to Operating Room, identified as Anthony Gregory and the procedure verified as laparoscopic appendectomy. A time out was performed and the above information confirmed.  The patient was placed in the supine position and general anesthesia was induced.  Antibiotic prophylaxis was administered and VT E prophylaxis was in place. A Foley catheter was placed by the nursing staff.   The abdomen was prepped and draped in a sterile fashion.  I decided to use the umbilical hernia as the entrance site for the 10 mm trocar.  The skin just cranial to the umbilicus was infiltrated with a one-to-one mixture of 0.25% bupivacaine and 1% lidocaine with epinephrine.  An incision was made just above the umbilicus.  This was carried down to the fascia using blunt dissection electrocautery.  The hernia sac was  circumferentially dissected away from the surrounding tissues and overlying umbilicus.  The sac was opened and found to contain only a small amount of fat.  The fat was reduced into the abdomen.  The hernia sac was excised and discarded.  The defect was enlarged slightly to admit the 10 mm port. Two 0 vicryl stitches were placed on the fascia and a Hasson trocar inserted. Pneumoperitoneum obtained. Two 5 mm ports were placed under direct visualization.  The appendix was identified and found to be acutely inflamed, without evidence of perforation or abscess. The appendix was carefully dissected away from the retroperitoneum.  The mesoappendix was divided with the Harmonic scalpel. The base of the appendix was dissected out and divided with a standard load Endo GIA.The appendix was placed in a Endo pouch bag and removed via the Hasson port. The right lower quadrant and pelvis was then irrigated with normal saline which was then aspirated. The right lower quadrant was inspected there was no sign of bleeding or bowel injury therefore pneumoperitoneum was released, all ports were removed.  The umbilical fascia, including the hernia defect was closed with 0 Vicryl interrupted sutures and the skin incisions were approximated with subcuticular 4-0 Monocryl. Dermabond was applied, followed by Steri-Strips. The patient tolerated the procedure well and there were no immediately apparent complications. The sponge lap and needle count were correct at the end of the procedure.  The patient was taken to the recovery room in stable condition to be admitted for continued care.   Fredirick Maudlin, MD, FACS

## 2019-02-21 NOTE — Discharge Instructions (Signed)
Laparoscopic Appendectomy, Adult, Care After  This sheet gives you information about how to care for yourself after your procedure. Your health care provider may also give you more specific instructions. If you have problems or questions, contact your health care provider.  What can I expect after the procedure?  After the procedure, it is common to have:  · Little energy for normal activities.  · Mild pain in the area where the incisions were made.  · Difficulty passing stool (constipation). This can be caused by:  ? Pain medicine.  ? A decrease in your activity.  Follow these instructions at home:  Medicines  · Take over-the-counter and prescription medicines only as told by your health care provider.  · If you were prescribed an antibiotic medicine, take it as told by your health care provider. Do not stop taking the antibiotic even if you start to feel better.  · Do not drive or use heavy machinery while taking prescription pain medicine.  · Ask your health care provider if the medicine prescribed to you can cause constipation. You may need to take steps to prevent or treat constipation, such as:  ? Drink enough fluid to keep your urine pale yellow.  ? Take over-the-counter or prescription medicines.  ? Eat foods that are high in fiber, such as beans, whole grains, and fresh fruits and vegetables.  ? Limit foods that are high in fat and processed sugars, such as fried or sweet foods.  Incision care    · Follow instructions from your health care provider about how to take care of your incisions. Make sure you:  ? Wash your hands with soap and water before and after you change your bandage (dressing). If soap and water are not available, use hand sanitizer.  ? Change your dressing as told by your health care provider.  ? Leave stitches (sutures), skin glue, or adhesive strips in place. These skin closures may need to stay in place for 2 weeks or longer. If adhesive strip edges start to loosen and curl up, you may  trim the loose edges. Do not remove adhesive strips completely unless your health care provider tells you to do that.  · Check your incision areas every day for signs of infection. Check for:  ? Redness, swelling, or pain.  ? Fluid or blood.  ? Warmth.  ? Pus or a bad smell.  Bathing  · Keep your incisions clean and dry. Clean them as often as told by your health care provider. To do this:  1. Gently wash the incisions with soap and water.  2. Rinse the incisions with water to remove all soap.  3. Pat the incisions dry with a clean towel. Do not rub the incisions.  · Do not take baths, swim, or use a hot tub for 2 weeks, or until your health care provider approves. You may take showers after 48 hours.  Activity    · Do not drive for 24 hours if you were given a sedative during your procedure.  · Rest after the procedure. Return to your normal activities as told by your health care provider. Ask your health care provider what activities are safe for you.  · For 3 weeks, or for as long as told by your health care provider:  ? Do not lift anything that is heavier than 10 lb (4.5 kg), or the limit that you are told.  ? Do not play contact sports.  General instructions  · If you   care provider. This is important. Contact a health care provider if:  You have redness, swelling, or pain around an incision.  You have fluid or blood coming from an incision.  Your incision feels warm to the touch.  You have pus or a bad smell coming from an incision or dressing.  Your incision edges break open after your sutures have been removed.  You have increasing pain in your shoulders.  You feel dizzy or you faint.  You develop shortness of breath.  You keep feeling  nauseous or you are vomiting.  You have diarrhea or you cannot control your bowel functions.  You lose your appetite.  You develop swelling or pain in your legs.  You develop a rash. Get help right away if you have:  A fever.  Difficulty breathing.  Sharp pains in your chest. Summary  After a laparoscopic appendectomy, it is common to have little energy for normal activities, mild pain in the area of the incisions, and constipation.  Infection is the most common complication after this procedure. Follow your health care provider's instructions about caring for yourself after the procedure.  Rest after the procedure. Return to your normal activities as told by your health care provider.  Contact your health care provider if you notice signs of infection around your incisions or you develop shortness of breath. Get help right away if you have a fever, chest pain, or difficulty breathing. This information is not intended to replace advice given to you by your health care provider. Make sure you discuss any questions you have with your health care provider. Document Released: 02/14/2005 Document Revised: 08/17/2017 Document Reviewed: 08/17/2017 Elsevier Patient Education  2020 [employer].

## 2019-02-21 NOTE — H&P (Signed)
Reason for Consult: acute appendicitis  Referring Physician: Lenise Arena, MD (Emergency Medicine)  Anthony Gregory is an 73 y.o. male.  HPI: he has a history of anemia, GERD, IBS, lipoma, prostate cancer, thrombocytopenia who presents to the ED for abdominal pain for the past 2 days, pain seems to be below the umbilicus, he has had nausea and vomiting.  Pain is 8 out of 10.  Patient states he moved his bowels yesterday. Pain does not radiate.  No fevers or chills.  CT scan consistent with acute appendicitis.  Past Medical History:  Diagnosis Date  . Anemia   . GERD (gastroesophageal reflux disease)   . History of splenomegaly   . Irritable bowel syndrome   . Lipoma   . MGUS (monoclonal gammopathy of unknown significance)   . Prostate cancer ([name])   . Thrombocytopenia ([name])     Past Surgical History:  Procedure Laterality Date  . Brachytherapy     Placement of beads for prostate cancer    No family history on file.  Social History:  reports that he has quit smoking. He has never used smokeless tobacco. He reports that he does not drink alcohol or use drugs.  Allergies: No Known Allergies  Medications: I have reviewed the patient's current medications.  Results for orders placed or performed during the hospital encounter of 02/21/19 (from the past 48 hour(s))  Lipase, blood     Status: None   Collection Time: 02/21/19  9:10 AM  Result Value Ref Range   Lipase 24 11 - 51 U/L    Comment: Performed at [facility], [address] [place], [place] [address]  Comprehensive metabolic panel     Status: Abnormal   Collection Time: 02/21/19  9:10 AM  Result Value Ref Range   Sodium 138 135 - 145 mmol/L   Potassium 3.8 3.5 - 5.1 mmol/L   Chloride 104 98 - 111 mmol/L   CO2 24 22 - 32 mmol/L   Glucose, Bld 110 (H) 70 - 99 mg/dL   BUN 15 8 - 23 mg/dL   Creatinine, Ser 0.99 0.61 - 1.24 mg/dL   Calcium 8.9 8.9 - 10.3 mg/dL   Total Protein 7.6 6.5 - 8.1 g/dL   Albumin 4.4 3.5 - 5.0 g/dL   AST 16 15 - 41 U/L   ALT 17 0 - 44 U/L   Alkaline Phosphatase 96 38 - 126 U/L   Total Bilirubin 1.4 (H) 0.3 - 1.2 mg/dL   GFR calc non Af Amer >60 >60 mL/min   GFR calc Af Amer >60 >60 mL/min   Anion gap 10 5 - 15    Comment: Performed at [facility], [address] [place], [place] [address]  CBC     Status: Abnormal   Collection Time: 02/21/19  9:10 AM  Result Value Ref Range   WBC 9.8 4.0 - 10.5 K/uL   RBC 5.02 4.22 - 5.81 MIL/uL   Hemoglobin 14.7 13.0 - 17.0 g/dL   HCT 41.6 39.0 - 52.0 %   MCV 82.9 80.0 - 100.0 fL   MCH 29.3 26.0 - 34.0 pg   MCHC 35.3 30.0 - 36.0 g/dL   RDW 14.3 11.5 - 15.5 %   Platelets 134 (L) 150 - 400 K/uL   nRBC 0.0 0.0 - 0.2 %    Comment: Performed at [facility], [address] [place], [place] [address]    CT ABDOMEN PELVIS W CONTRAST  Result Date: 02/21/2019 CLINICAL DATA:  Lower abdominal pain for the past 2 days. EXAM: CT ABDOMEN AND PELVIS WITH CONTRAST TECHNIQUE: Multidetector CT imaging of the abdomen and pelvis was performed using the standard protocol following bolus administration of intravenous contrast. CONTRAST:  133mL OMNIPAQUE IOHEXOL 300 MG/ML  SOLN COMPARISON:  Retroperitoneal ultrasound dated 11/01/2013. FINDINGS: Lower chest: Mild linear atelectasis or scarring at both lung bases. Hepatobiliary: Multiple small gallstones in the gallbladder measuring up to 4 mm in maximum diameter each. No gallbladder wall thickening or pericholecystic fluid. Unremarkable liver. Pancreas: Unremarkable. No pancreatic ductal dilatation or surrounding inflammatory changes. Spleen: Mildly enlarged, measuring 16.0 cm in length. Adrenals/Urinary Tract: Adrenal glands are unremarkable. Kidneys are normal, without renal calculi, focal lesion, or hydronephrosis. Bladder is unremarkable. Stomach/Bowel: Dilated appendix containing a 5 mm appendicolith proximally. Diffuse appendiceal wall thickening and enhancement with  periappendiceal soft tissue stranding and fluid filling the appendix. The appendix is arising from the cecum at the level of the upper pelvis and extending inferiorly with its tip in the mid right pelvis. The maximum appendiceal diameter is 10.8 mm. Small hiatal hernia. Multiple colonic diverticula, most pronounced in the sigmoid region. Unremarkable small bowel. Vascular/Lymphatic: Atheromatous arterial calcifications without aneurysm. No enlarged lymph nodes. Reproductive: Multiple prostate radiation seed implants. Other: Small umbilical hernia containing fat. Musculoskeletal: Lumbar and lower thoracic spine degenerative changes. IMPRESSION: 1. Acute appendicitis without abscess. 2. Cholelithiasis. 3. Mild splenomegaly. 4. Colonic diverticulosis. 5. Small hiatal hernia. Electronically Signed   By: Claudie Revering M.D.   On: 02/21/2019 10:34    Review of Systems  All other systems reviewed and are negative. Or as discussed in history of present illness Blood pressure (!) 152/80, pulse 99, temperature 99.6 F (37.6 C), temperature source Oral, resp. rate 18, height 5\' 10"  (1.778 m), weight 88.5 kg, SpO2 98 %. Physical Exam  Constitutional: He is oriented to person, place, and time. He appears well-developed and well-nourished. No distress.  HENT:  Head: Normocephalic and atraumatic.  Mouth and nose covered due to COVID-19 precautions  Eyes: Right eye exhibits no discharge. Left eye exhibits no discharge. No scleral icterus.  Neck: No tracheal deviation present.  Cardiovascular: Normal rate and regular rhythm.  Respiratory: Effort normal. No stridor. No respiratory distress.  GI: Soft. There is abdominal tenderness. There is no rebound and no guarding.  Tender to palpation in the right lower quadrant.  Umbilical hernia present.  Genitourinary:    Genitourinary Comments: Deferred   Musculoskeletal:        General: No deformity or edema.  Neurological: He is alert and oriented to person, place, and  time.  Skin: Skin is warm and dry.  Psychiatric: He has a normal mood and affect.    Assessment/Plan: 73 year old male with acute appendicitis.  I have recommended that he undergo appendectomy today.  I have discussed the risks of surgery with him.  These include, but are not limited to, bleeding, infection, need to leave a drain, need to convert to an open procedure, damage to surrounding tissues or structures, negative exploration/negative appendectomy.  He states that he has no assistance with transportation today, therefore he will need to be admitted postoperatively.  He says he thinks he can get a ride tomorrow.  We will proceed to the operating room, pending the OR and anesthesia availability.  Fredirick Maudlin 02/21/2019, 11:11 AM

## 2019-02-21 NOTE — ED Provider Notes (Signed)
[facility] Emergency Department Provider Note       Time seen: ----------------------------------------- 8:23 AM on 02/21/2019 -----------------------------------------   I have reviewed the triage vital signs and the nursing notes.  HISTORY   Chief Complaint Abdominal Pain    HPI Anthony Gregory is a 73 y.o. male with a history of anemia, GERD, IBS, lipoma, prostate cancer, thrombocytopenia who presents to the ED for abdominal pain for the past 2 days, pain seems to be below the umbilicus, he has had nausea and vomiting.  Pain is 8 out of 10.  Patient states he moved his bowels yesterday.  Past Medical History:  Diagnosis Date  . Anemia   . GERD (gastroesophageal reflux disease)   . History of splenomegaly   . Irritable bowel syndrome   . Lipoma   . MGUS (monoclonal gammopathy of unknown significance)   . Prostate cancer ([place])   . Thrombocytopenia [facility])     Patient Active Problem List   Diagnosis Date Noted  . MGUS (monoclonal gammopathy of unknown significance) 09/03/2014    History reviewed. No pertinent surgical history.  Allergies Patient has no known allergies.  Social History Social History   Tobacco Use  . Smoking status: Former Research scientist (life sciences)  . Smokeless tobacco: Never Used  Substance Use Topics  . Alcohol use: No    Alcohol/week: 0.0 standard drinks  . Drug use: No   Review of Systems Constitutional: Negative for fever. Cardiovascular: Negative for chest pain. Respiratory: Negative for shortness of breath. Gastrointestinal: Positive for abdominal pain, vomiting Musculoskeletal: Negative for back pain. Skin: Negative for rash. Neurological: Negative for headaches, focal weakness or numbness.  All systems negative/normal/unremarkable except as stated in the HPI  ____________________________________________   PHYSICAL EXAM:  VITAL SIGNS: ED Triage Vitals  Enc Vitals Group     BP 02/21/19 0820 (!) 162/84     Pulse  Rate 02/21/19 0820 99     Resp 02/21/19 0820 18     Temp 02/21/19 0820 99.6 F (37.6 C)     Temp Source 02/21/19 0820 Oral     SpO2 02/21/19 0820 98 %     Weight 02/21/19 0809 195 lb (88.5 kg)     Height 02/21/19 0809 5\' 10"  (1.778 m)     Head Circumference --      Peak Flow --      Pain Score 02/21/19 0809 8     Pain Loc --      Pain Edu? --      Excl. in [place]? --    Constitutional: Alert and oriented. Well appearing and in no distress. Eyes: Conjunctivae are normal. Normal extraocular movements. ENT      Head: Normocephalic and atraumatic.      Nose: No congestion/rhinnorhea.      Mouth/Throat: Mucous membranes are moist.      Neck: No stridor. Cardiovascular: Normal rate, regular rhythm. No murmurs, rubs, or gallops. Respiratory: Normal respiratory effort without tachypnea nor retractions. Breath sounds are clear and equal bilaterally. No wheezes/rales/rhonchi. Gastrointestinal: Specific lower abdominal tenderness, pain McBurney's point.  Normal bowel sounds.  Patient has rebound tenderness Musculoskeletal: Nontender with normal range of motion in extremities. No lower extremity tenderness nor edema. Neurologic:  Normal speech and language. No gross focal neurologic deficits are appreciated.  Skin:  Skin is warm, dry and intact. No rash noted. Psychiatric: Mood and affect are normal. Speech and behavior are normal.  ____________________________________________  EKG: Interpreted by me.  Sinus rhythm with rate of  97 bpm, possible old inferior infarct age-indeterminate, normal axis, normal QT  ____________________________________________  ED COURSE:  As part of my medical decision making, I reviewed the following data within the [place] History obtained from family if available, nursing notes, old chart and ekg, as well as notes from prior ED visits. Patient presented for abdominal pain and vomiting, we will assess with labs and imaging as indicated at this  time.   Procedures  Anthony Gregory was evaluated in Emergency Department on 02/21/2019 for the symptoms described in the history of present illness. He was evaluated in the context of the global COVID-19 pandemic, which necessitated consideration that the patient might be at risk for infection with the SARS-CoV-2 virus that causes COVID-19. Institutional protocols and algorithms that pertain to the evaluation of patients at risk for COVID-19 are in a state of rapid change based on information released by regulatory bodies including the CDC and federal and state organizations. These policies and algorithms were followed during the patient's care in the ED.  ____________________________________________   LABS (pertinent positives/negatives)  Labs Reviewed  COMPREHENSIVE METABOLIC PANEL - Abnormal; Notable for the following components:      Result Value   Glucose, Bld 110 (*)    Total Bilirubin 1.4 (*)    All other components within normal limits  CBC - Abnormal; Notable for the following components:   Platelets 134 (*)    All other components within normal limits  RESPIRATORY PANEL BY RT PCR (FLU A&B, COVID)  LIPASE, BLOOD  URINALYSIS, COMPLETE (UACMP) WITH MICROSCOPIC    RADIOLOGY Images were viewed by me  CT the abdomen pelvis with contrast Reveals acute appendicitis ____________________________________________   DIFFERENTIAL DIAGNOSIS   Appendicitis, diverticulitis, renal colic, UTI, pyelonephritis, bowel perforation  FINAL ASSESSMENT AND PLAN  Acute appendicitis   Plan: The patient had presented for abdominal pain and vomiting. Patient's labs were unremarkable, he has known mild thrombocytopenia. Patient's imaging did reveal acute appendicitis with appendicolith.  I have discussed with Dr. Celine Ahr from general surgery.   Laurence Aly, MD    Note: This note was generated in part or whole with voice recognition software. Voice recognition is usually quite  accurate but there are transcription errors that can and very often do occur. I apologize for any typographical errors that were not detected and corrected.     Earleen Newport, MD 02/21/19 1034

## 2019-02-21 NOTE — ED Notes (Signed)
OR arrived to transport pt to surgery

## 2019-02-21 NOTE — Transfer of Care (Signed)
Immediate Anesthesia Transfer of Care Note  Patient: Anthony Gregory  Procedure(s) Performed: APPENDECTOMY LAPAROSCOPIC (N/A )  Patient Location: PACU  Anesthesia Type:General  Level of Consciousness: drowsy  Airway & Oxygen Therapy: Patient Spontanous Breathing and Patient connected to face mask oxygen  Post-op Assessment: Report given to RN and Post -op Vital signs reviewed and stable  Post vital signs: Reviewed and stable  Last Vitals:  Vitals Value Taken Time  BP 153/80 02/21/19 1427  Temp 36.3 C 02/21/19 1427  Pulse 83 02/21/19 1427  Resp 16 02/21/19 1427  SpO2      Last Pain:  Vitals:   02/21/19 1212  TempSrc: Tympanic  PainSc: 1          Complications: No apparent anesthesia complications

## 2019-02-22 LAB — BASIC METABOLIC PANEL
Anion gap: 9 (ref 5–15)
BUN: 15 mg/dL (ref 8–23)
CO2: 23 mmol/L (ref 22–32)
Calcium: 8.7 mg/dL — ABNORMAL LOW (ref 8.9–10.3)
Chloride: 103 mmol/L (ref 98–111)
Creatinine, Ser: 1.12 mg/dL (ref 0.61–1.24)
GFR calc Af Amer: >60 mL/min (ref >60)
GFR calc non Af Amer: >60 mL/min (ref >60)
Glucose, Bld: 130 mg/dL — ABNORMAL HIGH (ref 70–99)
Potassium: 3.8 mmol/L (ref 3.5–5.1)
Sodium: 135 mmol/L (ref 135–145)

## 2019-02-22 LAB — CBC
HCT: 36.9 % — ABNORMAL LOW (ref 39.0–52.0)
Hemoglobin: 13.2 g/dL (ref 13.0–17.0)
MCH: 28.8 pg (ref 26.0–34.0)
MCHC: 35.8 g/dL (ref 30.0–36.0)
MCV: 80.6 fL (ref 80.0–100.0)
Platelets: 141 10*3/uL — ABNORMAL LOW (ref 150–400)
RBC: 4.58 MIL/uL (ref 4.22–5.81)
RDW: 14.6 % (ref 11.5–15.5)
WBC: 10.2 10*3/uL (ref 4.0–10.5)
nRBC: 0 % (ref 0.0–0.2)

## 2019-02-22 LAB — PHOSPHORUS: Phosphorus: 3.7 mg/dL (ref 2.5–4.6)

## 2019-02-22 LAB — MAGNESIUM: Magnesium: 2 mg/dL (ref 1.7–2.4)

## 2019-02-22 MED ORDER — HYDROCODONE-ACETAMINOPHEN 5-325 MG PO TABS: 1.0000 | ORAL_TABLET | Freq: Four times a day (QID) | ORAL | 0 refills | Status: DC | PRN

## 2019-02-22 NOTE — Discharge Summary (Signed)
  Patient ID: Anthony Gregory MRN: [id number] DOB/AGE: 1946-01-29 73 y.o.  Admit date: 02/21/2019 Discharge date: 02/22/2019   Discharge Diagnoses:  Active Problems:   Acute appendicitis   Umbilical hernia without obstruction and without gangrene   Procedures: Laparoscopic  appendectomy  Hospital Course: 74 yoadmitted with findings consistent with acute appendicitis and  was taken promptly to the operating room for an uneventful laparoscopic appendectomy.  Patient was kept overnight.  At The time of discharge the patient was ambulating,  pain was controlled.  His vital signs were stable and he was afebrile.   physical exam at discharge showed a pt  in no acute distress.  Awake and alert.  Abdomen: Soft incisions healing well without infection or peritonitis.  Extremities well-perfused and no edema.  Condition of the patient the time of discharge was stable     Disposition: Discharge disposition: 01-Home or Self Care       Discharge Instructions    Call MD for:  difficulty breathing, headache or visual disturbances   Complete by: As directed    Call MD for:  extreme fatigue   Complete by: As directed    Call MD for:  hives   Complete by: As directed    Call MD for:  persistant dizziness or light-headedness   Complete by: As directed    Call MD for:  persistant nausea and vomiting   Complete by: As directed    Call MD for:  redness, tenderness, or signs of infection (pain, swelling, redness, odor or green/yellow discharge around incision site)   Complete by: As directed    Call MD for:  severe uncontrolled pain   Complete by: As directed    Call MD for:  temperature >100.4   Complete by: As directed    Diet - low sodium heart healthy   Complete by: As directed    Discharge instructions   Complete by: As directed    Shower tomorrow   Increase activity slowly   Complete by: As directed    Lifting restrictions   Complete by: As directed    20 lbs x 6 wks      Allergies as of 02/22/2019   No Known Allergies     Medication List    TAKE these medications   HYDROcodone-acetaminophen 5-325 MG tablet Commonly known as: NORCO/VICODIN Take 1 tablet by mouth every 6 (six) hours as needed for moderate pain.      Follow-up Information    Fredirick Maudlin, MD. Schedule an appointment as soon as possible for a visit in 3 week(s).   Specialty: General Surgery Why: virtual appt Contact information: [address] [place] [address] [phone]            Caroleen Hamman, MD FACS

## 2019-02-25 NOTE — Anesthesia Postprocedure Evaluation (Signed)
Anesthesia Post Note  Patient: Anthony Gregory  Procedure(s) Performed: APPENDECTOMY LAPAROSCOPIC (N/A )  Patient location during evaluation: PACU Anesthesia Type: General Level of consciousness: awake and alert and oriented Pain management: pain level controlled Vital Signs Assessment: post-procedure vital signs reviewed and stable Respiratory status: spontaneous breathing Cardiovascular status: blood pressure returned to baseline Anesthetic complications: no     Last Vitals:  Vitals:   02/22/19 0542 02/22/19 1226  BP: 130/75 136/67  Pulse: 79 73  Resp: 18 18  Temp: 36.4 C 36.6 C  SpO2: 97% 100%    Last Pain:  Vitals:   02/22/19 1226  TempSrc: Oral  PainSc:                  Anoushka Divito

## 2019-02-26 LAB — SURGICAL PATHOLOGY

## 2019-03-14 ENCOUNTER — Ambulatory Visit (INDEPENDENT_AMBULATORY_CARE_PROVIDER_SITE_OTHER): Payer: Self-pay | Admitting: General Surgery

## 2019-03-14 ENCOUNTER — Other Ambulatory Visit: Payer: Self-pay

## 2019-03-14 ENCOUNTER — Encounter: Payer: Self-pay | Admitting: General Surgery

## 2019-03-14 VITALS — BP 176/82 | HR 75 | Temp 97.3°F | Resp 12 | Ht 70.0 in | Wt 195.6 lb

## 2019-03-14 DIAGNOSIS — Z9049 Acquired absence of other specified parts of digestive tract: Secondary | ICD-10-CM

## 2019-03-14 NOTE — Patient Instructions (Addendum)
Please call the office if you have any questions or concerns.  Please see your follow up appointments below.   GENERAL POST-OPERATIVE PATIENT INSTRUCTIONS   WOUND CARE INSTRUCTIONS:  Keep a dry clean dressing on the wound if there is drainage. The initial bandage may be removed after 24 hours.  Once the wound has quit draining you may leave it open to air.  If clothing rubs against the wound or causes irritation and the wound is not draining you may cover it with a dry dressing during the daytime.  Try to keep the wound dry and avoid ointments on the wound unless directed to do so.  If the wound becomes bright red and painful or starts to drain infected material that is not clear, please contact your physician immediately.  If the wound is mildly pink and has a thick firm ridge underneath it, this is normal, and is referred to as a healing ridge.  This will resolve over the next 4-6 weeks.  BATHING: You may shower if you have been informed of this by your surgeon. However, Please do not submerge in a tub, hot tub, or pool until incisions are completely sealed or have been told by your surgeon that you may do so.  DIET:  You may eat any foods that you can tolerate.  It is a good idea to eat a high fiber diet and take in plenty of fluids to prevent constipation.  If you do become constipated you may want to take a mild laxative or take ducolax tablets on a daily basis until your bowel habits are regular.  Constipation can be very uncomfortable, along with straining, after recent surgery.  ACTIVITY:  You are encouraged to cough and deep breath or use your incentive spirometer if you were given one, every 15-30 minutes when awake.  This will help prevent respiratory complications and low grade fevers post-operatively if you had a general anesthetic.  You may want to hug a pillow when coughing and sneezing to add additional support to the surgical area, if you had abdominal or chest surgery, which will  decrease pain during these times.  You are encouraged to walk and engage in light activity for the next two weeks.  You should not lift more than 20 pounds, until 04/04/2019 as it could put you at increased risk for complications.  Twenty pounds is roughly equivalent to a plastic bag of groceries. At that time- Listen to your body when lifting, if you have pain when lifting, stop and then try again in a few days. Soreness after doing exercises or activities of daily living is normal as you get back in to your normal routine.  MEDICATIONS:  Try to take narcotic medications and anti-inflammatory medications, such as tylenol, ibuprofen, naprosyn, etc., with food.  This will minimize stomach upset from the medication.  Should you develop nausea and vomiting from the pain medication, or develop a rash, please discontinue the medication and contact your physician.  You should not drive, make important decisions, or operate machinery when taking narcotic pain medication.  SUNBLOCK Use sun block to incision area over the next year if this area will be exposed to sun. This helps decrease scarring and will allow you avoid a permanent darkened area over your incision.  QUESTIONS:  Please feel free to call our office if you have any questions, and we will be glad to assist you.

## 2019-03-14 NOTE — Progress Notes (Signed)
Anthony Gregory is here today for a postoperative visit.  Anthony Gregory is a 74 year old man who developed acute appendicitis on 21 February 2019.  Anthony Gregory underwent an uncomplicated laparoscopic appendectomy and was discharged home the following day.  Anthony Gregory states that Anthony Gregory has done well since his operation.  Anthony Gregory denies any pain.  No fevers or chills.  No nausea, vomiting, or alterations in bowel function.  Anthony Gregory says that his appetite is good.  Today's Vitals   03/14/19 0913  BP: (!) 176/82  Pulse: 75  Resp: 12  Temp: (!) 97.3 F (36.3 C)  TempSrc: Temporal  SpO2: 97%  Weight: 195 lb 9.6 oz (88.7 kg)  Height: 5\' 10"  (1.778 m)  PainSc: 0-No pain   Body mass index is 28.07 kg/m. Focused abdominal examination: All of the laparoscopic port sites are healing nicely.  I did repair an umbilical hernia at the time of his surgery as well.  There is some normal postoperative swelling at that site, but no erythema, induration, or purulent drainage from any of the incisions.  Impression and plan: This is a 74 year old man who had acute appendicitis.  Anthony Gregory had a laparoscopic appendectomy, during which I also repaired an umbilical hernia.  Anthony Gregory is doing well from his surgery.  Anthony Gregory pointed out a sebaceous cyst on his forehead that Anthony Gregory would like to have removed.  We will schedule a procedure only visit to take care of that for him.  Anthony Gregory may resume all of his usual activities at this time, with the exception that Anthony Gregory should continue to avoid lifting anything heavier than 10 pounds for a total of 6 weeks from the time of his procedure, due to the umbilical hernia repair.  We will see him for the sebaceous cyst removal in the next week or 2.

## 2019-03-18 DIAGNOSIS — C61 Malignant neoplasm of prostate: Secondary | ICD-10-CM | POA: Diagnosis not present

## 2019-03-18 DIAGNOSIS — D472 Monoclonal gammopathy: Secondary | ICD-10-CM | POA: Diagnosis not present

## 2019-03-18 DIAGNOSIS — E7800 Pure hypercholesterolemia, unspecified: Secondary | ICD-10-CM | POA: Diagnosis not present

## 2019-03-18 DIAGNOSIS — D649 Anemia, unspecified: Secondary | ICD-10-CM | POA: Diagnosis not present

## 2019-03-18 DIAGNOSIS — D696 Thrombocytopenia, unspecified: Secondary | ICD-10-CM | POA: Diagnosis not present

## 2019-03-19 ENCOUNTER — Ambulatory Visit: Payer: PPO | Admitting: General Surgery

## 2019-03-19 ENCOUNTER — Other Ambulatory Visit: Payer: Self-pay

## 2019-03-19 ENCOUNTER — Encounter: Payer: Self-pay | Admitting: General Surgery

## 2019-03-19 DIAGNOSIS — L723 Sebaceous cyst: Secondary | ICD-10-CM

## 2019-03-19 NOTE — Patient Instructions (Addendum)
Steri Strips were applied to patient's wound at today's visit with his procedure.  Patient may take a shower tomorrow, but refrain from getting the wound submerged in the water.   If you have any questions or concerns, please give our office a call.

## 2019-03-19 NOTE — Progress Notes (Signed)
Sebaceous Cyst Excision Procedure Note  Pre-operative Diagnosis: sebaceous cyst  Post-operative Diagnosis: same  Locations:right forehead  Indications: growing sebaceous cyst  Anesthesia: Lidocaine 1% with epinephrine without added sodium bicarbonate  Procedure Details  History of allergy to iodine: no  Patient informed of the risks (including bleeding and infection) and benefits of the  procedure and Written informed consent obtained.  The lesion and surrounding area was given a sterile prep using chlorhexidine and draped in the usual sterile fashion. An incision was made over the cyst, which was dissected free of the surrounding tissue and removed.  The cyst was filled with typical sebaceous material.  The wound was closed with Dermabond after assuring good hemostasis.  Steri-strips were applied. The specimen was not sent for pathologic examination. The patient tolerated the procedure well.  EBL: 1 ml  Findings: Typical findings of uncomplicated sebaceous cyst  Condition: Stable  Complications: none.  Plan: 1. Instructed to keep the wound dry and covered for 24-48h and clean thereafter. 2. Warning signs of infection were reviewed.   3. Recommended that the patient use OTC analgesics as needed for pain.  4. RTC PRN.

## 2019-03-25 DIAGNOSIS — D472 Monoclonal gammopathy: Secondary | ICD-10-CM | POA: Diagnosis not present

## 2019-03-25 DIAGNOSIS — C61 Malignant neoplasm of prostate: Secondary | ICD-10-CM | POA: Diagnosis not present

## 2019-03-25 DIAGNOSIS — K219 Gastro-esophageal reflux disease without esophagitis: Secondary | ICD-10-CM | POA: Diagnosis not present

## 2019-03-25 DIAGNOSIS — Z9049 Acquired absence of other specified parts of digestive tract: Secondary | ICD-10-CM | POA: Diagnosis not present

## 2019-03-25 DIAGNOSIS — D649 Anemia, unspecified: Secondary | ICD-10-CM | POA: Diagnosis not present

## 2019-03-25 DIAGNOSIS — D696 Thrombocytopenia, unspecified: Secondary | ICD-10-CM | POA: Diagnosis not present

## 2019-03-25 DIAGNOSIS — E7800 Pure hypercholesterolemia, unspecified: Secondary | ICD-10-CM | POA: Diagnosis not present

## 2019-03-25 DIAGNOSIS — Z0000 Encounter for general adult medical examination without abnormal findings: Secondary | ICD-10-CM | POA: Diagnosis not present

## 2019-03-26 ENCOUNTER — Ambulatory Visit: Payer: PPO | Admitting: General Surgery

## 2019-04-22 ENCOUNTER — Ambulatory Visit: Payer: PPO | Attending: Internal Medicine

## 2019-04-22 DIAGNOSIS — Z23 Encounter for immunization: Secondary | ICD-10-CM | POA: Insufficient documentation

## 2019-04-22 NOTE — Progress Notes (Signed)
   Covid-19 Vaccination Clinic  Name:  Anthony Gregory    MRN: [id number] DOB: April 08, 1945  04/22/2019  Anthony Gregory was observed post Covid-19 immunization for 15 minutes without incidence. He was provided with Vaccine Information Sheet and instruction to access the V-Safe system.   Anthony Gregory was instructed to call 911 with any severe reactions post vaccine: Marland Kitchen Difficulty breathing  . Swelling of your face and throat  . A fast heartbeat  . A bad rash all over your body  . Dizziness and weakness    Immunizations Administered    Name Date Dose VIS Date Route   Moderna COVID-19 Vaccine 04/22/2019  2:15 PM 0.5 mL 01/29/2019 Intramuscular   Manufacturer: Moderna   Lot: [id number]   [place]:[id number]

## 2019-04-25 DIAGNOSIS — D696 Thrombocytopenia, unspecified: Secondary | ICD-10-CM | POA: Diagnosis not present

## 2019-04-25 DIAGNOSIS — D472 Monoclonal gammopathy: Secondary | ICD-10-CM | POA: Diagnosis not present

## 2019-04-25 DIAGNOSIS — E7800 Pure hypercholesterolemia, unspecified: Secondary | ICD-10-CM | POA: Diagnosis not present

## 2019-05-21 ENCOUNTER — Ambulatory Visit: Payer: PPO | Attending: Internal Medicine

## 2019-05-21 DIAGNOSIS — Z23 Encounter for immunization: Secondary | ICD-10-CM

## 2019-05-21 NOTE — Progress Notes (Signed)
   Covid-19 Vaccination Clinic  Name:  [name]    MRN: [id number] DOB: 04-Mar-1945  05/21/2019  [name] was observed post Covid-19 immunization for 15 minutes without incident. He was provided with Vaccine Information Sheet and instruction to access the V-Safe system.   [name] was instructed to call 911 with any severe reactions post vaccine: Marland Kitchen Difficulty breathing  . Swelling of face and throat  . A fast heartbeat  . A bad rash all over body  . Dizziness and weakness   Immunizations Administered    Name Date Dose VIS Date Route   Moderna COVID-19 Vaccine 05/21/2019 10:59 AM 0.5 mL 01/29/2019 Intramuscular   Manufacturer: Levan Hurst   LotKK:[id number]   [place]:[id number]

## 2019-11-01 DIAGNOSIS — D696 Thrombocytopenia, unspecified: Secondary | ICD-10-CM | POA: Diagnosis not present

## 2019-11-01 DIAGNOSIS — E7800 Pure hypercholesterolemia, unspecified: Secondary | ICD-10-CM | POA: Diagnosis not present

## 2019-11-01 DIAGNOSIS — D472 Monoclonal gammopathy: Secondary | ICD-10-CM | POA: Diagnosis not present

## 2019-11-01 DIAGNOSIS — C61 Malignant neoplasm of prostate: Secondary | ICD-10-CM | POA: Diagnosis not present

## 2019-11-11 DIAGNOSIS — K589 Irritable bowel syndrome without diarrhea: Secondary | ICD-10-CM | POA: Diagnosis not present

## 2019-11-11 DIAGNOSIS — C61 Malignant neoplasm of prostate: Secondary | ICD-10-CM | POA: Diagnosis not present

## 2019-11-11 DIAGNOSIS — Z23 Encounter for immunization: Secondary | ICD-10-CM | POA: Diagnosis not present

## 2019-11-11 DIAGNOSIS — D696 Thrombocytopenia, unspecified: Secondary | ICD-10-CM | POA: Diagnosis not present

## 2019-11-11 DIAGNOSIS — K219 Gastro-esophageal reflux disease without esophagitis: Secondary | ICD-10-CM | POA: Diagnosis not present

## 2019-11-11 DIAGNOSIS — D472 Monoclonal gammopathy: Secondary | ICD-10-CM | POA: Diagnosis not present

## 2019-11-11 DIAGNOSIS — D649 Anemia, unspecified: Secondary | ICD-10-CM | POA: Diagnosis not present

## 2019-11-11 DIAGNOSIS — E7800 Pure hypercholesterolemia, unspecified: Secondary | ICD-10-CM | POA: Diagnosis not present

## 2020-05-04 DIAGNOSIS — D472 Monoclonal gammopathy: Secondary | ICD-10-CM | POA: Diagnosis not present

## 2020-05-04 DIAGNOSIS — C61 Malignant neoplasm of prostate: Secondary | ICD-10-CM | POA: Diagnosis not present

## 2020-05-04 DIAGNOSIS — E7800 Pure hypercholesterolemia, unspecified: Secondary | ICD-10-CM | POA: Diagnosis not present

## 2020-05-04 DIAGNOSIS — D649 Anemia, unspecified: Secondary | ICD-10-CM | POA: Diagnosis not present

## 2020-05-04 DIAGNOSIS — D696 Thrombocytopenia, unspecified: Secondary | ICD-10-CM | POA: Diagnosis not present

## 2020-05-12 DIAGNOSIS — E7800 Pure hypercholesterolemia, unspecified: Secondary | ICD-10-CM | POA: Diagnosis not present

## 2020-05-12 DIAGNOSIS — Z0000 Encounter for general adult medical examination without abnormal findings: Secondary | ICD-10-CM | POA: Diagnosis not present

## 2020-05-12 DIAGNOSIS — D696 Thrombocytopenia, unspecified: Secondary | ICD-10-CM | POA: Diagnosis not present

## 2020-05-12 DIAGNOSIS — C61 Malignant neoplasm of prostate: Secondary | ICD-10-CM | POA: Diagnosis not present

## 2020-05-12 DIAGNOSIS — K219 Gastro-esophageal reflux disease without esophagitis: Secondary | ICD-10-CM | POA: Diagnosis not present

## 2020-05-12 DIAGNOSIS — D649 Anemia, unspecified: Secondary | ICD-10-CM | POA: Diagnosis not present

## 2020-05-12 DIAGNOSIS — D472 Monoclonal gammopathy: Secondary | ICD-10-CM | POA: Diagnosis not present

## 2020-07-28 DIAGNOSIS — L259 Unspecified contact dermatitis, unspecified cause: Secondary | ICD-10-CM | POA: Diagnosis not present

## 2020-07-28 DIAGNOSIS — L089 Local infection of the skin and subcutaneous tissue, unspecified: Secondary | ICD-10-CM | POA: Diagnosis not present

## 2020-07-28 DIAGNOSIS — L723 Sebaceous cyst: Secondary | ICD-10-CM | POA: Diagnosis not present

## 2020-11-09 DIAGNOSIS — E7800 Pure hypercholesterolemia, unspecified: Secondary | ICD-10-CM | POA: Diagnosis not present

## 2020-11-09 DIAGNOSIS — C61 Malignant neoplasm of prostate: Secondary | ICD-10-CM | POA: Diagnosis not present

## 2020-11-09 DIAGNOSIS — D472 Monoclonal gammopathy: Secondary | ICD-10-CM | POA: Diagnosis not present

## 2020-11-16 DIAGNOSIS — D472 Monoclonal gammopathy: Secondary | ICD-10-CM | POA: Diagnosis not present

## 2020-11-16 DIAGNOSIS — E7800 Pure hypercholesterolemia, unspecified: Secondary | ICD-10-CM | POA: Diagnosis not present

## 2020-11-16 DIAGNOSIS — K219 Gastro-esophageal reflux disease without esophagitis: Secondary | ICD-10-CM | POA: Diagnosis not present

## 2020-11-16 DIAGNOSIS — Z87898 Personal history of other specified conditions: Secondary | ICD-10-CM | POA: Diagnosis not present

## 2020-11-16 DIAGNOSIS — D696 Thrombocytopenia, unspecified: Secondary | ICD-10-CM | POA: Diagnosis not present

## 2020-11-16 DIAGNOSIS — C61 Malignant neoplasm of prostate: Secondary | ICD-10-CM | POA: Diagnosis not present

## 2020-12-16 DIAGNOSIS — R1031 Right lower quadrant pain: Secondary | ICD-10-CM | POA: Diagnosis not present

## 2020-12-16 DIAGNOSIS — R1030 Lower abdominal pain, unspecified: Secondary | ICD-10-CM | POA: Diagnosis not present

## 2020-12-16 DIAGNOSIS — D472 Monoclonal gammopathy: Secondary | ICD-10-CM | POA: Diagnosis not present

## 2020-12-16 DIAGNOSIS — R1032 Left lower quadrant pain: Secondary | ICD-10-CM | POA: Diagnosis not present

## 2020-12-16 DIAGNOSIS — D696 Thrombocytopenia, unspecified: Secondary | ICD-10-CM | POA: Diagnosis not present

## 2020-12-16 DIAGNOSIS — C61 Malignant neoplasm of prostate: Secondary | ICD-10-CM | POA: Diagnosis not present

## 2020-12-17 ENCOUNTER — Encounter: Payer: Self-pay | Admitting: General Surgery

## 2020-12-18 ENCOUNTER — Other Ambulatory Visit: Payer: Self-pay | Admitting: Physician Assistant

## 2020-12-18 DIAGNOSIS — R911 Solitary pulmonary nodule: Secondary | ICD-10-CM

## 2021-01-01 ENCOUNTER — Other Ambulatory Visit (HOSPITAL_COMMUNITY): Payer: Self-pay | Admitting: Physician Assistant

## 2021-01-01 ENCOUNTER — Other Ambulatory Visit: Payer: Self-pay | Admitting: Physician Assistant

## 2021-01-01 ENCOUNTER — Other Ambulatory Visit: Payer: Self-pay

## 2021-01-01 ENCOUNTER — Ambulatory Visit
Admission: RE | Admit: 2021-01-01 | Discharge: 2021-01-01 | Disposition: A | Discharge status: Home / Self Care | Payer: PPO | Source: Ambulatory Visit | Attending: Physician Assistant | Admitting: Physician Assistant

## 2021-01-01 DIAGNOSIS — R1032 Left lower quadrant pain: Secondary | ICD-10-CM

## 2021-01-01 DIAGNOSIS — K5730 Diverticulosis of large intestine without perforation or abscess without bleeding: Secondary | ICD-10-CM | POA: Diagnosis not present

## 2021-01-01 DIAGNOSIS — K8020 Calculus of gallbladder without cholecystitis without obstruction: Secondary | ICD-10-CM | POA: Diagnosis not present

## 2021-01-01 MED ORDER — IOHEXOL 300 MG/ML  SOLN
100.0000 mL | Freq: Once | INTRAMUSCULAR | Status: AC | PRN
  Administered 2021-01-01: 100 mL via INTRAVENOUS

## 2021-01-13 ENCOUNTER — Ambulatory Visit
Admission: RE | Admit: 2021-01-13 | Discharge: 2021-01-13 | Disposition: A | Discharge status: Home / Self Care | Payer: PPO | Source: Ambulatory Visit | Attending: Physician Assistant | Admitting: Physician Assistant

## 2021-01-13 ENCOUNTER — Other Ambulatory Visit: Payer: Self-pay

## 2021-01-13 DIAGNOSIS — R918 Other nonspecific abnormal finding of lung field: Secondary | ICD-10-CM | POA: Diagnosis not present

## 2021-01-13 DIAGNOSIS — R911 Solitary pulmonary nodule: Secondary | ICD-10-CM | POA: Diagnosis not present

## 2021-01-13 DIAGNOSIS — I700 Atherosclerosis of aorta: Secondary | ICD-10-CM | POA: Diagnosis not present

## 2021-01-19 ENCOUNTER — Other Ambulatory Visit: Payer: Self-pay | Admitting: Physician Assistant

## 2021-01-19 DIAGNOSIS — R911 Solitary pulmonary nodule: Secondary | ICD-10-CM

## 2021-04-21 ENCOUNTER — Other Ambulatory Visit: Payer: Self-pay

## 2021-04-21 ENCOUNTER — Ambulatory Visit
Admission: RE | Admit: 2021-04-21 | Discharge: 2021-04-21 | Disposition: A | Discharge status: Home / Self Care | Payer: PPO | Source: Ambulatory Visit | Attending: Physician Assistant | Admitting: Physician Assistant

## 2021-04-21 DIAGNOSIS — R911 Solitary pulmonary nodule: Secondary | ICD-10-CM | POA: Insufficient documentation

## 2021-04-21 DIAGNOSIS — I700 Atherosclerosis of aorta: Secondary | ICD-10-CM | POA: Diagnosis not present

## 2021-04-21 DIAGNOSIS — R918 Other nonspecific abnormal finding of lung field: Secondary | ICD-10-CM | POA: Diagnosis not present

## 2021-05-11 DIAGNOSIS — C61 Malignant neoplasm of prostate: Secondary | ICD-10-CM | POA: Diagnosis not present

## 2021-05-11 DIAGNOSIS — D696 Thrombocytopenia, unspecified: Secondary | ICD-10-CM | POA: Diagnosis not present

## 2021-05-11 DIAGNOSIS — D472 Monoclonal gammopathy: Secondary | ICD-10-CM | POA: Diagnosis not present

## 2021-05-11 DIAGNOSIS — E7800 Pure hypercholesterolemia, unspecified: Secondary | ICD-10-CM | POA: Diagnosis not present

## 2021-05-18 DIAGNOSIS — C61 Malignant neoplasm of prostate: Secondary | ICD-10-CM | POA: Diagnosis not present

## 2021-05-18 DIAGNOSIS — D649 Anemia, unspecified: Secondary | ICD-10-CM | POA: Diagnosis not present

## 2021-05-18 DIAGNOSIS — Z0000 Encounter for general adult medical examination without abnormal findings: Secondary | ICD-10-CM | POA: Diagnosis not present

## 2021-05-18 DIAGNOSIS — E538 Deficiency of other specified B group vitamins: Secondary | ICD-10-CM | POA: Diagnosis not present

## 2021-05-18 DIAGNOSIS — D696 Thrombocytopenia, unspecified: Secondary | ICD-10-CM | POA: Diagnosis not present

## 2021-05-18 DIAGNOSIS — K219 Gastro-esophageal reflux disease without esophagitis: Secondary | ICD-10-CM | POA: Diagnosis not present

## 2021-05-18 DIAGNOSIS — E7800 Pure hypercholesterolemia, unspecified: Secondary | ICD-10-CM | POA: Diagnosis not present

## 2021-05-18 DIAGNOSIS — D472 Monoclonal gammopathy: Secondary | ICD-10-CM | POA: Diagnosis not present

## 2021-05-26 DIAGNOSIS — H3554 Dystrophies primarily involving the retinal pigment epithelium: Secondary | ICD-10-CM | POA: Diagnosis not present

## 2021-06-17 DIAGNOSIS — H2511 Age-related nuclear cataract, right eye: Secondary | ICD-10-CM | POA: Diagnosis not present

## 2021-06-22 ENCOUNTER — Encounter: Payer: Self-pay | Admitting: Ophthalmology

## 2021-06-23 DIAGNOSIS — J019 Acute sinusitis, unspecified: Secondary | ICD-10-CM | POA: Diagnosis not present

## 2021-06-23 DIAGNOSIS — J209 Acute bronchitis, unspecified: Secondary | ICD-10-CM | POA: Diagnosis not present

## 2021-06-23 DIAGNOSIS — B9689 Other specified bacterial agents as the cause of diseases classified elsewhere: Secondary | ICD-10-CM | POA: Diagnosis not present

## 2021-06-23 DIAGNOSIS — Z03818 Encounter for observation for suspected exposure to other biological agents ruled out: Secondary | ICD-10-CM | POA: Diagnosis not present

## 2021-06-24 NOTE — Discharge Instructions (Signed)

## 2021-07-20 ENCOUNTER — Ambulatory Visit: Payer: PPO | Admitting: Anesthesiology

## 2021-07-20 ENCOUNTER — Encounter: Payer: Self-pay | Admitting: Ophthalmology

## 2021-07-20 ENCOUNTER — Ambulatory Visit
Admission: RE | Admit: 2021-07-20 | Discharge: 2021-07-20 | Disposition: A | Discharge status: Home / Self Care | Payer: PPO | Attending: Ophthalmology | Admitting: Ophthalmology

## 2021-07-20 ENCOUNTER — Other Ambulatory Visit: Payer: Self-pay

## 2021-07-20 ENCOUNTER — Encounter
Admission: RE | Disposition: A | Discharge status: Home / Self Care | Payer: Self-pay | Source: Home / Self Care | Attending: Ophthalmology

## 2021-07-20 DIAGNOSIS — Z87891 Personal history of nicotine dependence: Secondary | ICD-10-CM | POA: Insufficient documentation

## 2021-07-20 DIAGNOSIS — K219 Gastro-esophageal reflux disease without esophagitis: Secondary | ICD-10-CM | POA: Diagnosis not present

## 2021-07-20 DIAGNOSIS — H2511 Age-related nuclear cataract, right eye: Secondary | ICD-10-CM | POA: Insufficient documentation

## 2021-07-20 DIAGNOSIS — Z8546 Personal history of malignant neoplasm of prostate: Secondary | ICD-10-CM | POA: Insufficient documentation

## 2021-07-20 DIAGNOSIS — H25811 Combined forms of age-related cataract, right eye: Secondary | ICD-10-CM | POA: Diagnosis not present

## 2021-07-20 HISTORY — PX: CATARACT EXTRACTION W/PHACO: SHX586

## 2021-07-20 SURGERY — PHACOEMULSIFICATION, CATARACT, WITH IOL INSERTION
Anesthesia: Monitor Anesthesia Care | Site: Eye | Laterality: Right

## 2021-07-20 MED ORDER — LACTATED RINGERS IV SOLN: INTRAVENOUS | Status: DC

## 2021-07-20 MED ORDER — MOXIFLOXACIN HCL 0.5 % OP SOLN
OPHTHALMIC | Status: DC | PRN
  Administered 2021-07-20: 0.2 mL via OPHTHALMIC

## 2021-07-20 MED ORDER — ONDANSETRON HCL 4 MG/2ML IJ SOLN: 4.0000 mg | Freq: Once | INTRAMUSCULAR | Status: DC | PRN

## 2021-07-20 MED ORDER — SIGHTPATH DOSE#1 NA CHONDROIT SULF-NA HYALURON 40-17 MG/ML IO SOLN
INTRAOCULAR | Status: DC | PRN
  Administered 2021-07-20: 1 mL via INTRAOCULAR

## 2021-07-20 MED ORDER — ACETAMINOPHEN 160 MG/5ML PO SOLN: 975.0000 mg | Freq: Once | ORAL | Status: DC | PRN

## 2021-07-20 MED ORDER — FENTANYL CITRATE (PF) 100 MCG/2ML IJ SOLN
INTRAMUSCULAR | Status: DC | PRN
  Administered 2021-07-20 (×2): 50 ug via INTRAVENOUS

## 2021-07-20 MED ORDER — SIGHTPATH DOSE#1 BSS IO SOLN
INTRAOCULAR | Status: DC | PRN
  Administered 2021-07-20: 59 mL via OPHTHALMIC

## 2021-07-20 MED ORDER — TETRACAINE HCL 0.5 % OP SOLN
1.0000 [drp] | OPHTHALMIC | Status: DC | PRN
  Administered 2021-07-20 (×3): 1 [drp] via OPHTHALMIC

## 2021-07-20 MED ORDER — ACETAMINOPHEN 500 MG PO TABS: 1000.0000 mg | ORAL_TABLET | Freq: Once | ORAL | Status: DC | PRN

## 2021-07-20 MED ORDER — MIDAZOLAM HCL 2 MG/2ML IJ SOLN
INTRAMUSCULAR | Status: DC | PRN
  Administered 2021-07-20: 2 mg via INTRAVENOUS

## 2021-07-20 MED ORDER — SIGHTPATH DOSE#1 BSS IO SOLN
INTRAOCULAR | Status: DC | PRN
  Administered 2021-07-20: 15 mL via INTRAOCULAR

## 2021-07-20 MED ORDER — SIGHTPATH DOSE#1 BSS IO SOLN
INTRAOCULAR | Status: DC | PRN
  Administered 2021-07-20: 2 mL

## 2021-07-20 MED ORDER — ARMC OPHTHALMIC DILATING DROPS
1.0000 "application " | OPHTHALMIC | Status: DC | PRN
  Administered 2021-07-20 (×3): 1 via OPHTHALMIC

## 2021-07-20 MED ORDER — ONDANSETRON HCL 4 MG/2ML IJ SOLN
INTRAMUSCULAR | Status: DC | PRN
  Administered 2021-07-20: 4 mg via INTRAVENOUS

## 2021-07-20 MED ORDER — BRIMONIDINE TARTRATE-TIMOLOL 0.2-0.5 % OP SOLN
OPHTHALMIC | Status: DC | PRN
  Administered 2021-07-20: 1 [drp] via OPHTHALMIC

## 2021-07-20 SURGICAL SUPPLY — 12 items
CANNULA ANT/CHMB 27G (MISCELLANEOUS) IMPLANT
CANNULA ANT/CHMB 27GA (MISCELLANEOUS) IMPLANT
CATARACT SUITE SIGHTPATH (MISCELLANEOUS) ×2 IMPLANT
FEE CATARACT SUITE SIGHTPATH (MISCELLANEOUS) ×1 IMPLANT
GLOVE SURG ENC TEXT LTX SZ8 (GLOVE) ×2 IMPLANT
GLOVE SURG TRIUMPH 8.0 PF LTX (GLOVE) ×2 IMPLANT
LENS IOL TECNIS EYHANCE 22.0 (Intraocular Lens) ×1 IMPLANT
NDL FILTER BLUNT 18X1 1/2 (NEEDLE) ×1 IMPLANT
NEEDLE FILTER BLUNT 18X 1/2SAF (NEEDLE) ×1
NEEDLE FILTER BLUNT 18X1 1/2 (NEEDLE) ×1 IMPLANT
SYR 3ML LL SCALE MARK (SYRINGE) ×2 IMPLANT
WATER STERILE IRR 250ML POUR (IV SOLUTION) ×2 IMPLANT

## 2021-07-20 NOTE — Anesthesia Preprocedure Evaluation (Signed)
Anesthesia Evaluation  Patient identified by MRN, date of birth, ID band Patient awake    Reviewed: Allergy & Precautions, H&P , NPO status , Patient's Chart, lab work & pertinent test results, reviewed documented beta blocker date and time   Airway Mallampati: II  TM Distance: >3 FB Neck ROM: full    Dental no notable dental hx.    Pulmonary neg pulmonary ROS, former smoker,    Pulmonary exam normal breath sounds clear to auscultation       Cardiovascular Exercise Tolerance: Good negative cardio ROS   Rhythm:regular Rate:Normal     Neuro/Psych negative neurological ROS  negative psych ROS   GI/Hepatic negative GI ROS, Neg liver ROS, GERD  ,  Endo/Other  negative endocrine ROS  Renal/GU negative Renal ROS  negative genitourinary   Musculoskeletal   Abdominal   Peds  Hematology negative hematology ROS (+)   Anesthesia Other Findings H/o thrombocytopenia, splenomegaly, MGUS, prostate CA  Reproductive/Obstetrics negative OB ROS                             Anesthesia Physical Anesthesia Plan  ASA: 3  Anesthesia Plan: MAC   Post-op Pain Management:    Induction:   PONV Risk Score and Plan: 1 and TIVA, Midazolam and Treatment may vary due to age or medical condition  Airway Management Planned:   Additional Equipment:   Intra-op Plan:   Post-operative Plan:   Informed Consent: I have reviewed the patients History and Physical, chart, labs and discussed the procedure including the risks, benefits and alternatives for the proposed anesthesia with the patient or authorized representative who has indicated his/her understanding and acceptance.     Dental Advisory Given  Plan Discussed with: CRNA  Anesthesia Plan Comments:         Anesthesia Quick Evaluation

## 2021-07-20 NOTE — Anesthesia Procedure Notes (Signed)
Procedure Name: MAC Date/Time: 07/20/2021 8:42 AM Performed by: Jeannene Patella, CRNA Pre-anesthesia Checklist: Patient identified, Emergency Drugs available, Suction available, Timeout performed and Patient being monitored Patient Re-evaluated:Patient Re-evaluated prior to induction Oxygen Delivery Method: Nasal cannula Placement Confirmation: positive ETCO2

## 2021-07-20 NOTE — Anesthesia Postprocedure Evaluation (Signed)
Anesthesia Post Note  Patient: Anthony Gregory  Procedure(s) Performed: CATARACT EXTRACTION PHACO AND INTRAOCULAR LENS PLACEMENT (IOC) RIGHT 8.07 00:48.4 (Right: Eye)     Anesthesia Post Evaluation No notable events documented.  April Manson

## 2021-07-20 NOTE — H&P (Signed)
[facility]   Primary Care Physician:  Adin Hector, MD Ophthalmologist: Dr. George Ina  Pre-Procedure History & Physical: HPI:  Anthony Gregory is a 76 y.o. male here for cataract surgery.   Past Medical History:  Diagnosis Date   Anemia    GERD (gastroesophageal reflux disease)    History of splenomegaly    Irritable bowel syndrome    Lipoma    MGUS (monoclonal gammopathy of unknown significance)    Prostate cancer ([place])    Thrombocytopenia ([place])     Past Surgical History:  Procedure Laterality Date   Brachytherapy  01/2011   Placement of beads for prostate cancer   LAPAROSCOPIC APPENDECTOMY N/A 02/21/2019   Procedure: APPENDECTOMY LAPAROSCOPIC;  Surgeon: Fredirick Maudlin, MD;  Location: [facility] ORS;  Service: General;  Laterality: N/A;    Prior to Admission medications   Medication Sig Start Date End Date Taking? Authorizing Provider  esomeprazole (NEXIUM) 20 MG capsule Take 20 mg by mouth daily as needed.   Yes [provider]  vitamin B-12 (CYANOCOBALAMIN) 1000 MCG tablet Take 1,000 mcg by mouth daily.   Yes [provider]    Allergies as of 06/18/2021   (No Known Allergies)    History reviewed. No pertinent family history.  Social History   Socioeconomic History   Marital status: Single    Spouse name: Not on file   Number of children: Not on file   Years of education: Not on file   Highest education level: Not on file  Occupational History   Not on file  Tobacco Use   Smoking status: Former    Years: 14.00    Types: Cigarettes    Quit date: 53    Years since quitting: 33.4   Smokeless tobacco: Never  Vaping Use   Vaping Use: Never used  Substance and Sexual Activity   Alcohol use: No    Comment: Rare   Drug use: No   Sexual activity: Not on file  Other Topics Concern   Not on file  Social History Narrative   Not on file   Social Determinants of Health   Financial Resource Strain: Not on file  Food  Insecurity: Not on file  Transportation Needs: Not on file  Physical Activity: Not on file  Stress: Not on file  Social Connections: Not on file  Intimate Partner Violence: Not on file    Review of Systems: See HPI, otherwise negative ROS  Physical Exam: BP (!) 173/91   Pulse 71   Temp (!) 97.5 F (36.4 C) (Temporal)   Resp 20   Ht '5\' 10"'$  (1.778 m)   Wt 88 kg   SpO2 98%   BMI 27.84 kg/m  General:   Alert, cooperative in NAD Head:  Normocephalic and atraumatic. Respiratory:  Normal work of breathing. Cardiovascular:  RRR  Impression/Plan: Anthony Gregory is here for cataract surgery.  Risks, benefits, limitations, and alternatives regarding cataract surgery have been reviewed with the patient.  Questions have been answered.  All parties agreeable.   Birder Robson, MD  07/20/2021, 8:34 AM

## 2021-07-20 NOTE — Op Note (Signed)
PREOPERATIVE DIAGNOSIS:  Nuclear sclerotic cataract of the right eye.   POSTOPERATIVE DIAGNOSIS:  Cataract   OPERATIVE PROCEDURE:ORPROCALL@   SURGEON:  Birder Robson, MD.   ANESTHESIA:  Anesthesiologist: April Manson, MD CRNA: Jeannene Patella, CRNA  1.      Managed anesthesia care. 2.      0.42m of Shugarcaine was instilled in the eye following the paracentesis.   COMPLICATIONS:  None.   TECHNIQUE:   Stop and chop   DESCRIPTION OF PROCEDURE:  The patient was examined and consented in the preoperative holding area where the aforementioned topical anesthesia was applied to the right eye and then brought back to the Operating Room where the right eye was prepped and draped in the usual sterile ophthalmic fashion and a lid speculum was placed. A paracentesis was created with the side port blade and the anterior chamber was filled with viscoelastic. A near clear corneal incision was performed with the steel keratome. A continuous curvilinear capsulorrhexis was performed with a cystotome followed by the capsulorrhexis forceps. Hydrodissection and hydrodelineation were carried out with BSS on a blunt cannula. The lens was removed in a stop and chop  technique and the remaining cortical material was removed with the irrigation-aspiration handpiece. The capsular bag was inflated with viscoelastic and the Technis ZCB00  lens was placed in the capsular bag without complication. The remaining viscoelastic was removed from the eye with the irrigation-aspiration handpiece. The wounds were hydrated. The anterior chamber was flushed with BSS and the eye was inflated to physiologic pressure. 0.146mof Vigamox was placed in the anterior chamber. The wounds were found to be water tight. The eye was dressed with Combigan. The patient was given protective glasses to wear throughout the day and a shield with which to sleep tonight. The patient was also given drops with which to begin a drop regimen today  and will follow-up with me in one day. Implant Name Type Inv. Item Serial No. Manufacturer Lot No. LRB No. Used Action  LENS IOL TECNIS EYHANCE 22.0 - [id number] Lens LENS IOL TECNIS EYHANCE 22.0 [id number]  Right 1 Implanted   Procedure(s): CATARACT EXTRACTION PHACO AND INTRAOCULAR LENS PLACEMENT (IOC) RIGHT 8.07 00:48.4 (Right)  Electronically signed: WiBirder Robson/23/2023 8:57 AM

## 2021-07-20 NOTE — Transfer of Care (Signed)
Immediate Anesthesia Transfer of Care Note  Patient: Anthony Gregory  Procedure(s) Performed: CATARACT EXTRACTION PHACO AND INTRAOCULAR LENS PLACEMENT (IOC) RIGHT 8.07 00:48.4 (Right: Eye)  Patient Location: PACU  Anesthesia Type: MAC  Level of Consciousness: awake, alert  and patient cooperative  Airway and Oxygen Therapy: Patient Spontanous Breathing and Patient connected to supplemental oxygen  Post-op Assessment: Post-op Vital signs reviewed, Patient's Cardiovascular Status Stable, Respiratory Function Stable, Patent Airway and No signs of Nausea or vomiting  Post-op Vital Signs: Reviewed and stable  Complications: No notable events documented.

## 2021-07-21 ENCOUNTER — Encounter: Payer: Self-pay | Admitting: Ophthalmology

## 2021-07-29 NOTE — Discharge Instructions (Signed)

## 2021-08-03 ENCOUNTER — Encounter: Payer: Self-pay | Admitting: Ophthalmology

## 2021-08-03 ENCOUNTER — Ambulatory Visit: Payer: PPO | Admitting: Anesthesiology

## 2021-08-03 ENCOUNTER — Other Ambulatory Visit: Payer: Self-pay

## 2021-08-03 ENCOUNTER — Ambulatory Visit
Admission: RE | Admit: 2021-08-03 | Discharge: 2021-08-03 | Disposition: A | Discharge status: Home / Self Care | Payer: PPO | Attending: Ophthalmology | Admitting: Ophthalmology

## 2021-08-03 ENCOUNTER — Encounter
Admission: RE | Disposition: A | Discharge status: Home / Self Care | Payer: Self-pay | Source: Home / Self Care | Attending: Ophthalmology

## 2021-08-03 DIAGNOSIS — H2512 Age-related nuclear cataract, left eye: Secondary | ICD-10-CM | POA: Diagnosis not present

## 2021-08-03 DIAGNOSIS — H25812 Combined forms of age-related cataract, left eye: Secondary | ICD-10-CM | POA: Diagnosis not present

## 2021-08-03 DIAGNOSIS — Z87891 Personal history of nicotine dependence: Secondary | ICD-10-CM | POA: Diagnosis not present

## 2021-08-03 HISTORY — PX: CATARACT EXTRACTION W/PHACO: SHX586

## 2021-08-03 SURGERY — PHACOEMULSIFICATION, CATARACT, WITH IOL INSERTION
Anesthesia: Monitor Anesthesia Care | Site: Eye | Laterality: Left

## 2021-08-03 MED ORDER — FENTANYL CITRATE (PF) 100 MCG/2ML IJ SOLN
INTRAMUSCULAR | Status: DC | PRN
  Administered 2021-08-03: 100 ug via INTRAVENOUS

## 2021-08-03 MED ORDER — SIGHTPATH DOSE#1 BSS IO SOLN
INTRAOCULAR | Status: DC | PRN
  Administered 2021-08-03: 55 mL via OPHTHALMIC

## 2021-08-03 MED ORDER — ARMC OPHTHALMIC DILATING DROPS
1.0000 "application " | OPHTHALMIC | Status: DC | PRN
  Administered 2021-08-03 (×3): 1 via OPHTHALMIC

## 2021-08-03 MED ORDER — MIDAZOLAM HCL 2 MG/2ML IJ SOLN
INTRAMUSCULAR | Status: DC | PRN
  Administered 2021-08-03: 2 mg via INTRAVENOUS

## 2021-08-03 MED ORDER — BRIMONIDINE TARTRATE-TIMOLOL 0.2-0.5 % OP SOLN
OPHTHALMIC | Status: DC | PRN
  Administered 2021-08-03: 1 [drp] via OPHTHALMIC

## 2021-08-03 MED ORDER — MOXIFLOXACIN HCL 0.5 % OP SOLN
OPHTHALMIC | Status: DC | PRN
  Administered 2021-08-03: 0.2 mL via OPHTHALMIC

## 2021-08-03 MED ORDER — SIGHTPATH DOSE#1 BSS IO SOLN
INTRAOCULAR | Status: DC | PRN
  Administered 2021-08-03: 15 mL

## 2021-08-03 MED ORDER — SIGHTPATH DOSE#1 NA CHONDROIT SULF-NA HYALURON 40-17 MG/ML IO SOLN
INTRAOCULAR | Status: DC | PRN
  Administered 2021-08-03: 1 mL via INTRAOCULAR

## 2021-08-03 MED ORDER — SIGHTPATH DOSE#1 BSS IO SOLN
INTRAOCULAR | Status: DC | PRN
  Administered 2021-08-03: 1 mL

## 2021-08-03 MED ORDER — TETRACAINE HCL 0.5 % OP SOLN
1.0000 [drp] | OPHTHALMIC | Status: DC | PRN
  Administered 2021-08-03 (×3): 1 [drp] via OPHTHALMIC

## 2021-08-03 SURGICAL SUPPLY — 12 items
CANNULA ANT/CHMB 27G (MISCELLANEOUS) IMPLANT
CANNULA ANT/CHMB 27GA (MISCELLANEOUS) IMPLANT
CATARACT SUITE SIGHTPATH (MISCELLANEOUS) ×2 IMPLANT
FEE CATARACT SUITE SIGHTPATH (MISCELLANEOUS) ×1 IMPLANT
GLOVE SURG ENC TEXT LTX SZ8 (GLOVE) ×2 IMPLANT
GLOVE SURG TRIUMPH 8.0 PF LTX (GLOVE) ×2 IMPLANT
LENS IOL TECNIS EYHANCE 22.5 (Intraocular Lens) ×1 IMPLANT
NDL FILTER BLUNT 18X1 1/2 (NEEDLE) ×1 IMPLANT
NEEDLE FILTER BLUNT 18X 1/2SAF (NEEDLE) ×1
NEEDLE FILTER BLUNT 18X1 1/2 (NEEDLE) ×1 IMPLANT
SYR 3ML LL SCALE MARK (SYRINGE) ×2 IMPLANT
WATER STERILE IRR 250ML POUR (IV SOLUTION) ×2 IMPLANT

## 2021-08-03 NOTE — H&P (Signed)
[facility]   Primary Care Physician:  Adin Hector, MD Ophthalmologist: Dr. George Ina  Pre-Procedure History & Physical: HPI:  Anthony Gregory is a 76 y.o. male here for cataract surgery.   Past Medical History:  Diagnosis Date   Anemia    GERD (gastroesophageal reflux disease)    History of splenomegaly    Irritable bowel syndrome    Lipoma    MGUS (monoclonal gammopathy of unknown significance)    Prostate cancer ([place])    Thrombocytopenia ([place])     Past Surgical History:  Procedure Laterality Date   Brachytherapy  01/2011   Placement of beads for prostate cancer   CATARACT EXTRACTION W/PHACO Right 07/20/2021   Procedure: CATARACT EXTRACTION PHACO AND INTRAOCULAR LENS PLACEMENT ([place]) RIGHT 8.07 00:48.4;  Surgeon: Birder Robson, MD;  Location: [facility];  Service: Ophthalmology;  Laterality: Right;   LAPAROSCOPIC APPENDECTOMY N/A 02/21/2019   Procedure: APPENDECTOMY LAPAROSCOPIC;  Surgeon: Fredirick Maudlin, MD;  Location: [facility] ORS;  Service: General;  Laterality: N/A;    Prior to Admission medications   Medication Sig Start Date End Date Taking? Authorizing Provider  esomeprazole (NEXIUM) 20 MG capsule Take 20 mg by mouth daily as needed.   Yes [provider]  vitamin B-12 (CYANOCOBALAMIN) 1000 MCG tablet Take 1,000 mcg by mouth daily.   Yes [provider]    Allergies as of 05/27/2021   (No Known Allergies)    History reviewed. No pertinent family history.  Social History   Socioeconomic History   Marital status: Single    Spouse name: Not on file   Number of children: Not on file   Years of education: Not on file   Highest education level: Not on file  Occupational History   Not on file  Tobacco Use   Smoking status: Former    Years: 14.00    Types: Cigarettes    Quit date: 17    Years since quitting: 33.4   Smokeless tobacco: Never  Vaping Use   Vaping Use: Never used  Substance and Sexual Activity    Alcohol use: No    Comment: Rare   Drug use: No   Sexual activity: Not on file  Other Topics Concern   Not on file  Social History Narrative   Not on file   Social Determinants of Health   Financial Resource Strain: Not on file  Food Insecurity: Not on file  Transportation Needs: Not on file  Physical Activity: Not on file  Stress: Not on file  Social Connections: Not on file  Intimate Partner Violence: Not on file    Review of Systems: See HPI, otherwise negative ROS  Physical Exam: Pulse 78   Temp 97.9 F (36.6 C) (Temporal)   Resp 14   Wt 89.8 kg   SpO2 99%   BMI 28.41 kg/m  General:   Alert, cooperative in NAD Head:  Normocephalic and atraumatic. Respiratory:  Normal work of breathing. Cardiovascular:  RRR  Impression/Plan: Anthony Gregory is here for cataract surgery.  Risks, benefits, limitations, and alternatives regarding cataract surgery have been reviewed with the patient.  Questions have been answered.  All parties agreeable.   Birder Robson, MD  08/03/2021, 11:13 AM

## 2021-08-03 NOTE — Anesthesia Preprocedure Evaluation (Signed)
Anesthesia Evaluation  Patient identified by MRN, date of birth, ID band Patient awake    Reviewed: Allergy & Precautions, H&P , NPO status , Patient's Chart, lab work & pertinent test results  Airway Mallampati: II  TM Distance: >3 FB Neck ROM: full    Dental no notable dental hx.    Pulmonary neg pulmonary ROS, former smoker,    Pulmonary exam normal        Cardiovascular negative cardio ROS Normal cardiovascular exam Rhythm:regular Rate:Normal     Neuro/Psych negative neurological ROS  negative psych ROS   GI/Hepatic Neg liver ROS, Medicated,  Endo/Other  negative endocrine ROS  Renal/GU negative Renal ROS  negative genitourinary   Musculoskeletal   Abdominal   Peds  Hematology   Anesthesia Other Findings   Reproductive/Obstetrics                             Anesthesia Physical Anesthesia Plan  ASA: 1  Anesthesia Plan: MAC   Post-op Pain Management:    Induction:   PONV Risk Score and Plan: 1 and TIVA, Midazolam and Treatment may vary due to age or medical condition  Airway Management Planned:   Additional Equipment:   Intra-op Plan:   Post-operative Plan:   Informed Consent: I have reviewed the patients History and Physical, chart, labs and discussed the procedure including the risks, benefits and alternatives for the proposed anesthesia with the patient or authorized representative who has indicated his/her understanding and acceptance.       Plan Discussed with:   Anesthesia Plan Comments:         Anesthesia Quick Evaluation

## 2021-08-03 NOTE — Op Note (Signed)
PREOPERATIVE DIAGNOSIS:  Nuclear sclerotic cataract of the left eye.   POSTOPERATIVE DIAGNOSIS:  Nuclear sclerotic cataract of the left eye.   OPERATIVE PROCEDURE:ORPROCALL@   SURGEON:  Birder Robson, MD.   ANESTHESIA:  Anesthesiologist: Elgie Collard, MD CRNA: Silvana Newness, CRNA  1.      Managed anesthesia care. 2.     0.52m of Shugarcaine was instilled following the paracentesis   COMPLICATIONS:  None.   TECHNIQUE:   Stop and chop   DESCRIPTION OF PROCEDURE:  The patient was examined and consented in the preoperative holding area where the aforementioned topical anesthesia was applied to the left eye and then brought back to the Operating Room where the left eye was prepped and draped in the usual sterile ophthalmic fashion and a lid speculum was placed. A paracentesis was created with the side port blade and the anterior chamber was filled with viscoelastic. A near clear corneal incision was performed with the steel keratome. A continuous curvilinear capsulorrhexis was performed with a cystotome followed by the capsulorrhexis forceps. Hydrodissection and hydrodelineation were carried out with BSS on a blunt cannula. The lens was removed in a stop and chop  technique and the remaining cortical material was removed with the irrigation-aspiration handpiece. The capsular bag was inflated with viscoelastic and the Technis ZCB00 lens was placed in the capsular bag without complication. The remaining viscoelastic was removed from the eye with the irrigation-aspiration handpiece. The wounds were hydrated. The anterior chamber was flushed with BSS and the eye was inflated to physiologic pressure. 0.118mVigamox was placed in the anterior chamber. The wounds were found to be water tight. The eye was dressed with Combigan. The patient was given protective glasses to wear throughout the day and a shield with which to sleep tonight. The patient was also given drops with which to begin a drop regimen  today and will follow-up with me in one day. Implant Name Type Inv. Item Serial No. Manufacturer Lot No. LRB No. Used Action  LENS IOL TECNIS EYHANCE 22.5 - [id number] Lens LENS IOL TECNIS EYHANCE 22.5 [id number]  Left 1 Implanted    Procedure(s) with comments: CATARACT EXTRACTION PHACO AND INTRAOCULAR LENS PLACEMENT (IOC) LEFT (Left) - 8.78 1:00.4  Electronically signed: WiBirder Robson/07/2021 11:38 AM

## 2021-08-03 NOTE — Transfer of Care (Signed)
Immediate Anesthesia Transfer of Care Note  Patient: Anthony Gregory  Procedure(s) Performed: CATARACT EXTRACTION PHACO AND INTRAOCULAR LENS PLACEMENT (IOC) LEFT (Left: Eye)  Patient Location: PACU  Anesthesia Type: MAC  Level of Consciousness: awake, alert  and patient cooperative  Airway and Oxygen Therapy: Patient Spontanous Breathing and Patient connected to supplemental oxygen  Post-op Assessment: Post-op Vital signs reviewed, Patient's Cardiovascular Status Stable, Respiratory Function Stable, Patent Airway and No signs of Nausea or vomiting  Post-op Vital Signs: Reviewed and stable  Complications: No notable events documented.

## 2021-08-03 NOTE — Anesthesia Postprocedure Evaluation (Signed)
Anesthesia Post Note  Patient: Anthony Gregory  Procedure(s) Performed: CATARACT EXTRACTION PHACO AND INTRAOCULAR LENS PLACEMENT (IOC) LEFT (Left: Eye)     Patient location during evaluation: PACU Anesthesia Type: MAC Level of consciousness: awake and alert Pain management: pain level controlled Vital Signs Assessment: post-procedure vital signs reviewed and stable Respiratory status: spontaneous breathing Cardiovascular status: stable Anesthetic complications: no   No notable events documented.  Gillian Scarce

## 2021-08-04 ENCOUNTER — Encounter: Payer: Self-pay | Admitting: Ophthalmology

## 2021-10-19 ENCOUNTER — Other Ambulatory Visit: Payer: Self-pay | Admitting: Internal Medicine

## 2021-10-19 ENCOUNTER — Ambulatory Visit
Admission: RE | Admit: 2021-10-19 | Discharge: 2021-10-19 | Disposition: A | Discharge status: Home / Self Care | Payer: PPO | Source: Ambulatory Visit | Attending: Internal Medicine | Admitting: Internal Medicine

## 2021-10-19 DIAGNOSIS — R1084 Generalized abdominal pain: Secondary | ICD-10-CM

## 2021-10-19 DIAGNOSIS — R109 Unspecified abdominal pain: Secondary | ICD-10-CM | POA: Diagnosis not present

## 2021-10-19 DIAGNOSIS — D696 Thrombocytopenia, unspecified: Secondary | ICD-10-CM | POA: Diagnosis not present

## 2021-10-19 DIAGNOSIS — C61 Malignant neoplasm of prostate: Secondary | ICD-10-CM

## 2021-10-19 DIAGNOSIS — Z87898 Personal history of other specified conditions: Secondary | ICD-10-CM | POA: Diagnosis not present

## 2021-10-19 DIAGNOSIS — K219 Gastro-esophageal reflux disease without esophagitis: Secondary | ICD-10-CM | POA: Diagnosis not present

## 2021-10-19 DIAGNOSIS — K760 Fatty (change of) liver, not elsewhere classified: Secondary | ICD-10-CM | POA: Diagnosis not present

## 2021-10-19 DIAGNOSIS — I700 Atherosclerosis of aorta: Secondary | ICD-10-CM | POA: Diagnosis not present

## 2021-10-19 LAB — POCT I-STAT CREATININE: Creatinine, Ser: 1.1 mg/dL (ref 0.61–1.24)

## 2021-10-19 MED ORDER — IOHEXOL 300 MG/ML  SOLN
100.0000 mL | Freq: Once | INTRAMUSCULAR | Status: AC | PRN
  Administered 2021-10-19: 100 mL via INTRAVENOUS

## 2021-11-22 DIAGNOSIS — C61 Malignant neoplasm of prostate: Secondary | ICD-10-CM | POA: Diagnosis not present

## 2021-11-22 DIAGNOSIS — E7800 Pure hypercholesterolemia, unspecified: Secondary | ICD-10-CM | POA: Diagnosis not present

## 2021-11-22 DIAGNOSIS — E538 Deficiency of other specified B group vitamins: Secondary | ICD-10-CM | POA: Diagnosis not present

## 2021-11-22 DIAGNOSIS — D472 Monoclonal gammopathy: Secondary | ICD-10-CM | POA: Diagnosis not present

## 2021-11-24 DIAGNOSIS — Z961 Presence of intraocular lens: Secondary | ICD-10-CM | POA: Diagnosis not present

## 2021-11-29 DIAGNOSIS — E538 Deficiency of other specified B group vitamins: Secondary | ICD-10-CM | POA: Diagnosis not present

## 2021-11-29 DIAGNOSIS — C61 Malignant neoplasm of prostate: Secondary | ICD-10-CM | POA: Diagnosis not present

## 2021-11-29 DIAGNOSIS — D696 Thrombocytopenia, unspecified: Secondary | ICD-10-CM | POA: Diagnosis not present

## 2021-11-29 DIAGNOSIS — R1084 Generalized abdominal pain: Secondary | ICD-10-CM | POA: Diagnosis not present

## 2021-11-29 DIAGNOSIS — E7800 Pure hypercholesterolemia, unspecified: Secondary | ICD-10-CM | POA: Diagnosis not present

## 2021-11-29 DIAGNOSIS — D649 Anemia, unspecified: Secondary | ICD-10-CM | POA: Diagnosis not present

## 2021-11-29 DIAGNOSIS — D472 Monoclonal gammopathy: Secondary | ICD-10-CM | POA: Diagnosis not present

## 2021-11-29 DIAGNOSIS — Z23 Encounter for immunization: Secondary | ICD-10-CM | POA: Diagnosis not present

## 2021-11-29 DIAGNOSIS — Z1211 Encounter for screening for malignant neoplasm of colon: Secondary | ICD-10-CM | POA: Diagnosis not present

## 2021-11-29 DIAGNOSIS — L989 Disorder of the skin and subcutaneous tissue, unspecified: Secondary | ICD-10-CM | POA: Diagnosis not present

## 2021-11-29 DIAGNOSIS — K219 Gastro-esophageal reflux disease without esophagitis: Secondary | ICD-10-CM | POA: Diagnosis not present

## 2021-12-07 ENCOUNTER — Ambulatory Visit: Payer: PPO | Admitting: Dermatology

## 2021-12-07 DIAGNOSIS — L820 Inflamed seborrheic keratosis: Secondary | ICD-10-CM | POA: Diagnosis not present

## 2021-12-07 DIAGNOSIS — L814 Other melanin hyperpigmentation: Secondary | ICD-10-CM

## 2021-12-07 DIAGNOSIS — D485 Neoplasm of uncertain behavior of skin: Secondary | ICD-10-CM

## 2021-12-07 DIAGNOSIS — L821 Other seborrheic keratosis: Secondary | ICD-10-CM

## 2021-12-07 NOTE — Progress Notes (Signed)
   New Patient Visit  Subjective  Anthony Gregory is a 76 y.o. male who presents for the following: New Patient (Initial Visit).  Patient presents today for a growth on the left chest. Present for years, but growing and shirt rubs and irritates.   Referral from Dr Ramonita Lab, MD.  The following portions of the chart were reviewed this encounter and updated as appropriate:       Review of Systems:  No other skin or systemic complaints except as noted in HPI or Assessment and Plan.  Objective  Well appearing patient in no apparent distress; mood and affect are within normal limits.  A focused examination was performed including face, chest. Relevant physical exam findings are noted in the Assessment and Plan.  Left Medial Shoulder 1.2 cm waxy tan papule with erythema    Assessment & Plan  Neoplasm of uncertain behavior of skin Left Medial Shoulder  Epidermal / dermal shaving  Lesion diameter (cm):  1.2 Informed consent: discussed and consent obtained   Timeout: patient name, date of birth, surgical site, and procedure verified   Patient was prepped and draped in usual sterile fashion: area prepped with alcohol. Anesthesia: the lesion was anesthetized in a standard fashion   Anesthetic:  1% lidocaine w/ epinephrine 1-100,000 local infiltration Instrument used: flexible razor blade   Hemostasis achieved with: pressure, aluminum chloride and electrodesiccation   Outcome: patient tolerated procedure well   Post-procedure details: wound care instructions given   Post-procedure details comment:  Ointment and a small bandage applied  Specimen 1 - Surgical pathology Differential Diagnosis: Inflamed SK vs other  Check Margins: No  Symptomatic, irritating, patient would like treated.   Discussed cryotherapy vs shave removal. Patient prefers shave removal.  Discussed resulting small scar with shave removal, and possible recurrence of lesion.  Recommend vaseline ointment to  area daily and cover until healed.  Recommend photoprotection/sunscreen to area to prevent discoloration of scar.  Once healed, may apply OTC Serica scar gel bid to thickened scars.   Lentigines - Scattered tan macules - Due to sun exposure - Benign-appearing, observe - Recommend daily broad spectrum sunscreen SPF 30+ to sun-exposed areas, reapply every 2 hours as needed. - Call for any changes   Seborrheic Keratoses - Stuck-on, waxy, tan-brown papules  - Benign-appearing - Discussed benign etiology and prognosis. - Observe - Call for any changes  Return if symptoms worsen or fail to improve.  IJamesetta Orleans, CMA, am acting as scribe for Brendolyn Patty, MD .  Documentation: I have reviewed the above documentation for accuracy and completeness, and I agree with the above.  Brendolyn Patty MD

## 2021-12-07 NOTE — Patient Instructions (Addendum)
Wound Care Instructions  Cleanse wound gently with soap and water once a day then pat dry with clean gauze. Apply a thin coat of Petrolatum (petroleum jelly, "Vaseline") over the wound (unless you have an allergy to this). We recommend that you use a new, sterile tube of Vaseline. Do not pick or remove scabs. Do not remove the yellow or white "healing tissue" from the base of the wound.  Cover the wound with fresh, clean, nonstick gauze and secure with paper tape. You may use Band-Aids in place of gauze and tape if the wound is small enough, but would recommend trimming much of the tape off as there is often too much. Sometimes Band-Aids can irritate the skin.  You should call the office for your biopsy report after 1 week if you have not already been contacted.  If you experience any problems, such as abnormal amounts of bleeding, swelling, significant bruising, significant pain, or evidence of infection, please call the office immediately.  FOR ADULT SURGERY PATIENTS: If you need something for pain relief you may take 1 extra strength Tylenol (acetaminophen) AND 2 Ibuprofen (200mg each) together every 4 hours as needed for pain. (do not take these if you are allergic to them or if you have a reason you should not take them.) Typically, you may only need pain medication for 1 to 3 days.     Due to recent changes in healthcare laws, you may see results of your pathology and/or laboratory studies on MyChart before the doctors have had a chance to review them. We understand that in some cases there may be results that are confusing or concerning to you. Please understand that not all results are received at the same time and often the doctors may need to interpret multiple results in order to provide you with the best plan of care or course of treatment. Therefore, we ask that you please give us 2 business days to thoroughly review all your results before contacting the office for clarification. Should  we see a critical lab result, you will be contacted sooner.   If You Need Anything After Your Visit  If you have any questions or concerns for your doctor, please call our main line at [phone] and press option 4 to reach your doctor's medical assistant. If no one answers, please leave a voicemail as directed and we will return your call as soon as possible. Messages left after 4 pm will be answered the following business day.   You may also send us a message via MyChart. We typically respond to MyChart messages within 1-2 business days.  For prescription refills, please ask your pharmacy to contact our office. Our fax number is [phone].  If you have an urgent issue when the clinic is closed that cannot wait until the next business day, you can page your doctor at the number below.    Please note that while we do our best to be available for urgent issues outside of office hours, we are not available 24/7.   If you have an urgent issue and are unable to reach us, you may choose to seek medical care at your doctor's office, retail clinic, urgent care center, or emergency room.  If you have a medical emergency, please immediately call 911 or go to the emergency department.  Pager Numbers  - Dr. Kowalski: [phone]  - Dr. Moye: [phone]  - Dr. Kentner: [phone]  In the event of inclement weather, please call our main line at   [phone] for an update on the status of any delays or closures.  Dermatology Medication Tips: Please keep the boxes that topical medications come in in order to help keep track of the instructions about where and how to use these. Pharmacies typically print the medication instructions only on the boxes and not directly on the medication tubes.   If your medication is too expensive, please contact our office at [phone] option 4 or send us a message through MyChart.   We are unable to tell what your co-pay for medications will be in  advance as this is different depending on your insurance coverage. However, we may be able to find a substitute medication at lower cost or fill out paperwork to get insurance to cover a needed medication.   If a prior authorization is required to get your medication covered by your insurance company, please allow us 1-2 business days to complete this process.  Drug prices often vary depending on where the prescription is filled and some pharmacies may offer cheaper prices.  The website www.goodrx.com contains coupons for medications through different pharmacies. The prices here do not account for what the cost may be with help from insurance (it may be cheaper with your insurance), but the website can give you the price if you did not use any insurance.  - You can print the associated coupon and take it with your prescription to the pharmacy.  - You may also stop by our office during regular business hours and pick up a GoodRx coupon card.  - If you need your prescription sent electronically to a different pharmacy, notify our office through [place] MyChart or by phone at [phone] option 4.     Si Usted Necesita Algo Despus de Su Visita  Tambin puede enviarnos un mensaje a travs de MyChart. Por lo general respondemos a los mensajes de MyChart en el transcurso de 1 a 2 das hbiles.  Para renovar recetas, por favor pida a su farmacia que se ponga en contacto con nuestra oficina. Nuestro nmero de fax es el [phone].  Si tiene un asunto urgente cuando la clnica est cerrada y que no puede esperar hasta el siguiente da hbil, puede llamar/localizar a su doctor(a) al nmero que aparece a continuacin.   Por favor, tenga en cuenta que aunque hacemos todo lo posible para estar disponibles para asuntos urgentes fuera del horario de oficina, no estamos disponibles las 24 horas del da, los 7 das de la semana.   Si tiene un problema urgente y no puede comunicarse con nosotros, puede  optar por buscar atencin mdica  en el consultorio de su doctor(a), en una clnica privada, en un centro de atencin urgente o en una sala de emergencias.  Si tiene una emergencia mdica, por favor llame inmediatamente al 911 o vaya a la sala de emergencias.  Nmeros de bper  - Dr. Kowalski: [phone]  - Dra. Moye: [phone]  - Dra. Fiala: [phone]  En caso de inclemencias del tiempo, por favor llame a nuestra lnea principal al [phone] para una actualizacin sobre el estado de cualquier retraso o cierre.  Consejos para la medicacin en dermatologa: Por favor, guarde las cajas en las que vienen los medicamentos de uso tpico para ayudarle a seguir las instrucciones sobre dnde y cmo usarlos. Las farmacias generalmente imprimen las instrucciones del medicamento slo en las cajas y no directamente en los tubos del medicamento.   Si su medicamento es muy caro, por favor, pngase en contacto con   nuestra oficina llamando al [phone] y presione la opcin 4 o envenos un mensaje a travs de MyChart.   No podemos decirle cul ser su copago por los medicamentos por adelantado ya que esto es diferente dependiendo de la cobertura de su seguro. Sin embargo, es posible que podamos encontrar un medicamento sustituto a menor costo o llenar un formulario para que el seguro cubra el medicamento que se considera necesario.   Si se requiere una autorizacin previa para que su compaa de seguros cubra su medicamento, por favor permtanos de 1 a 2 das hbiles para completar este proceso.  Los precios de los medicamentos varan con frecuencia dependiendo del lugar de dnde se surte la receta y alguna farmacias pueden ofrecer precios ms baratos.  El sitio web www.goodrx.com tiene cupones para medicamentos de diferentes farmacias. Los precios aqu no tienen en cuenta lo que podra costar con la ayuda del seguro (puede ser ms barato con su seguro), pero el sitio web puede darle el  precio si no utiliz ningn seguro.  - Puede imprimir el cupn correspondiente y llevarlo con su receta a la farmacia.  - Tambin puede pasar por nuestra oficina durante el horario de atencin regular y recoger una tarjeta de cupones de GoodRx.  - Si necesita que su receta se enve electrnicamente a una farmacia diferente, informe a nuestra oficina a travs de MyChart de [place] o por telfono llamando al [phone] y presione la opcin 4.  

## 2021-12-13 ENCOUNTER — Telehealth: Payer: Self-pay

## 2021-12-13 NOTE — Telephone Encounter (Signed)
-----   Message from Brendolyn Patty, MD sent at 12/13/2021 11:19 AM EDT ----- Skin , left medial shoulder SEBORRHEIC KERATOSIS, IRRITATED  Benign ISK   - please call patient

## 2021-12-13 NOTE — Telephone Encounter (Signed)
Advised patient biopsy was benign and no further treatment needed.

## 2022-01-20 IMAGING — CT CT ABD-PELV W/ CM
2 of 5 series · 16 of 46 positions shown, 18 images · IV contrast (APPLIED)
Comparison: CT abdomen and pelvis 02/21/2019

CLINICAL DATA: Lower abdominal pain

EXAM:
CT ABDOMEN AND PELVIS WITH CONTRAST
TECHNIQUE: Multidetector CT imaging of the abdomen and pelvis was performed
using the standard protocol following bolus administration of
intravenous contrast.
CONTRAST:  100mL OMNIPAQUE IOHEXOL 300 MG/ML  SOLN

[Series 2: routine abd/pel with · axial · 0.84mm/px · z∈[+124,+579]mm · 13 of 103 slices shown, 15 images]
[im 6/103  soft-tissue]
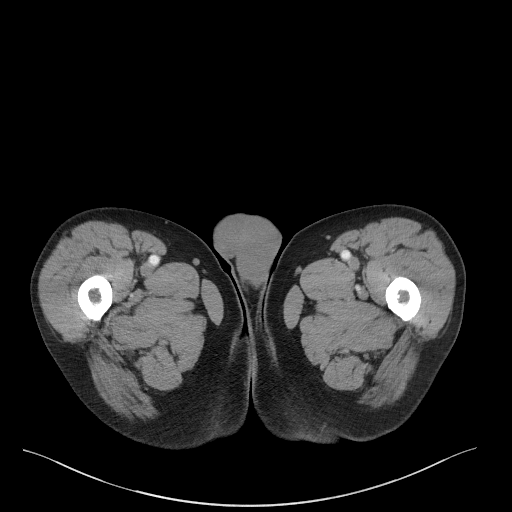
[im 6/103  bone]
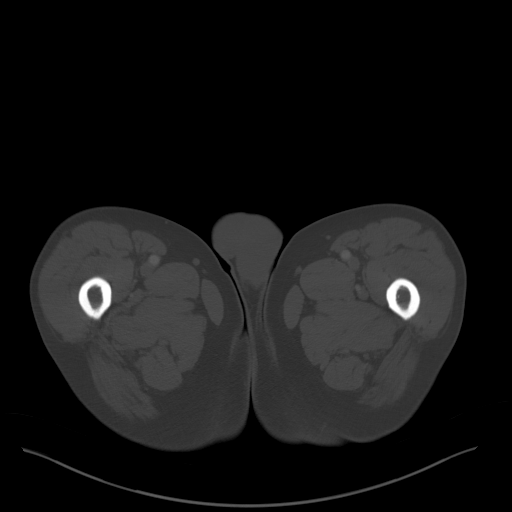
[im 17/103  soft-tissue]
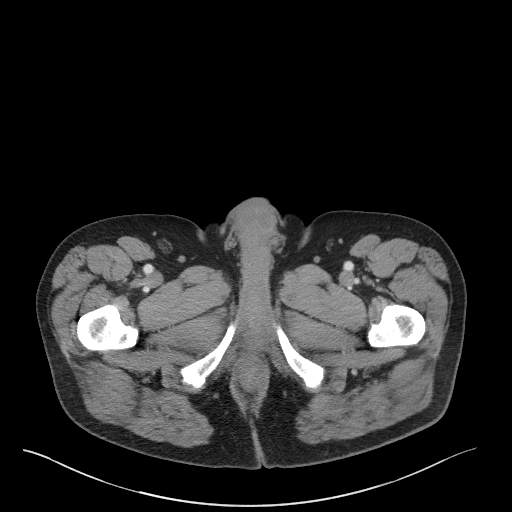
[im 22/103  soft-tissue]
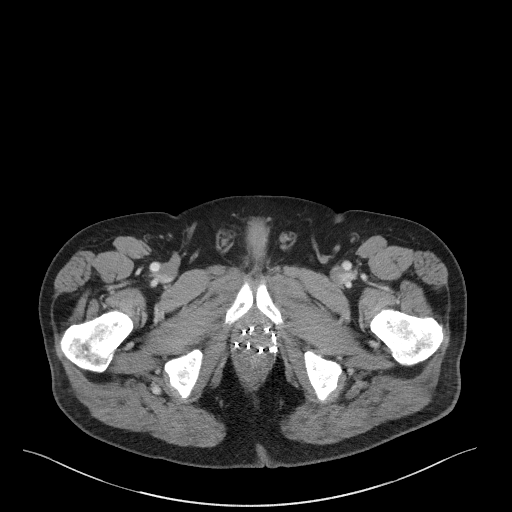
[im 27/103  soft-tissue]
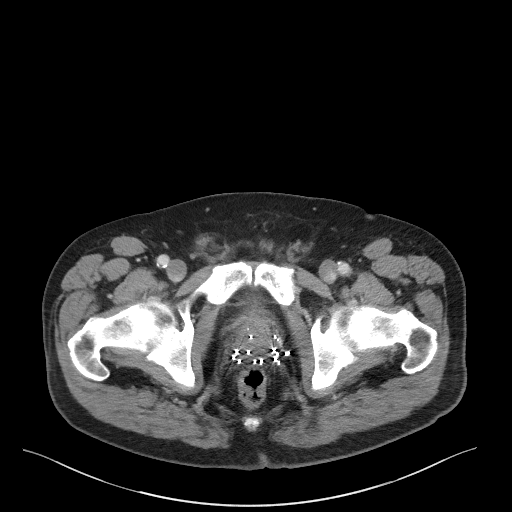
[im 38/103  soft-tissue]
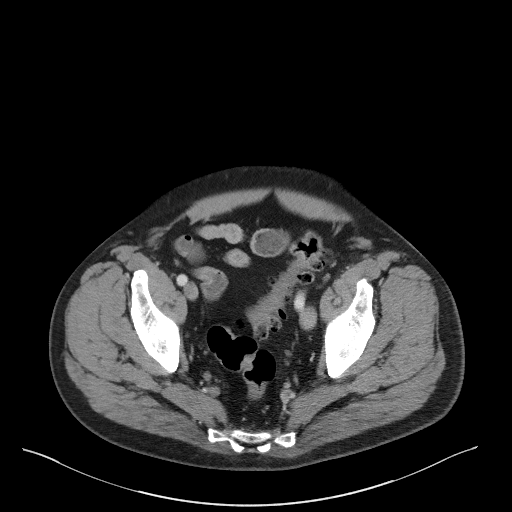
[im 43/103  soft-tissue]
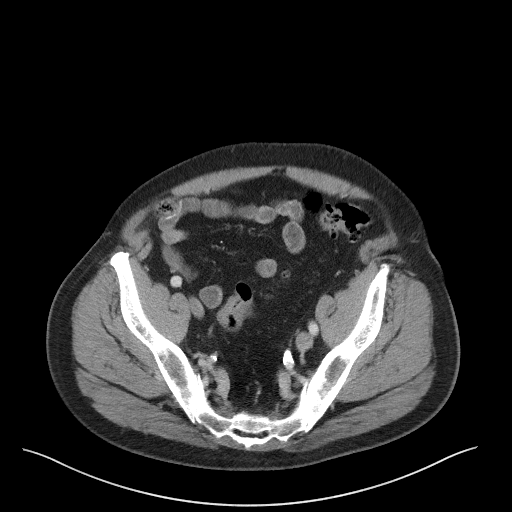
[im 54/103  soft-tissue]
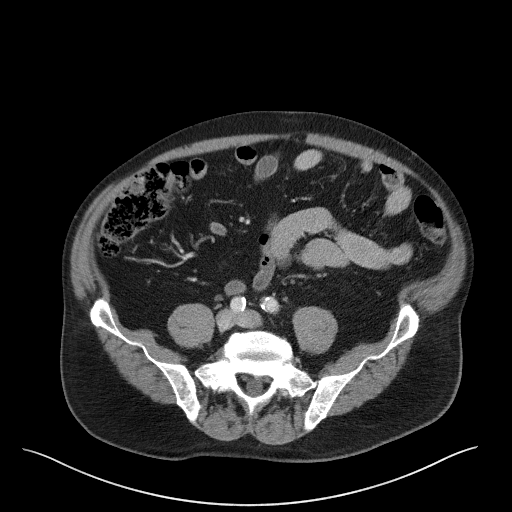
[im 60/103  soft-tissue]
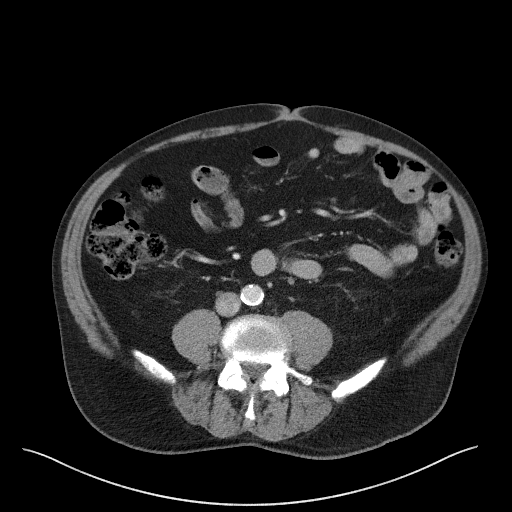
[im 65/103  soft-tissue]
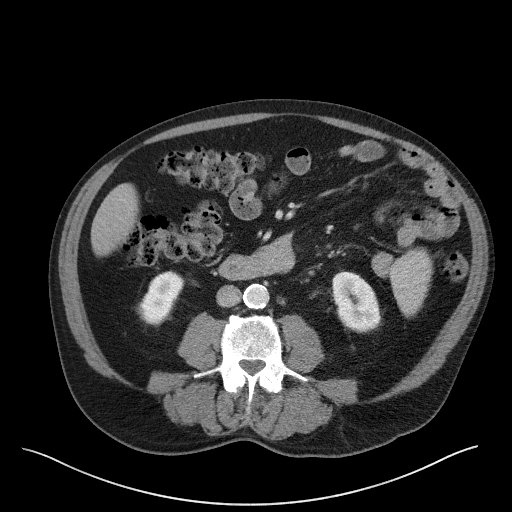
[im 65/103  bone]
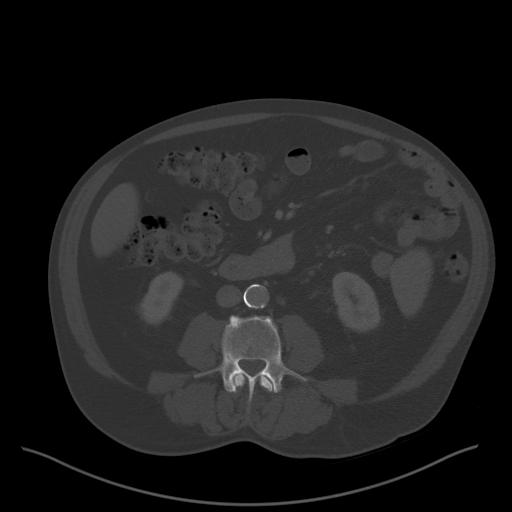
[im 76/103  soft-tissue]
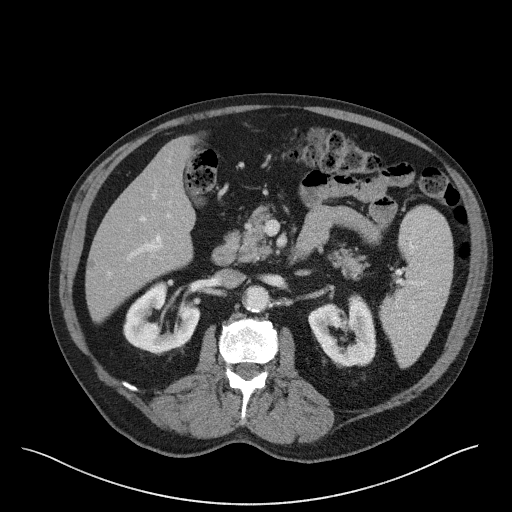
[im 81/103  soft-tissue]
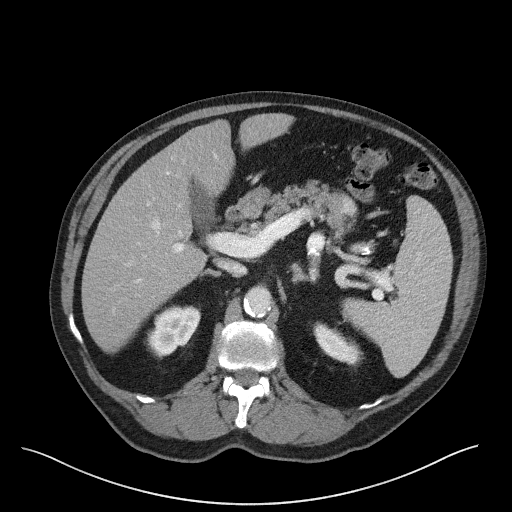
[im 86/103  soft-tissue]
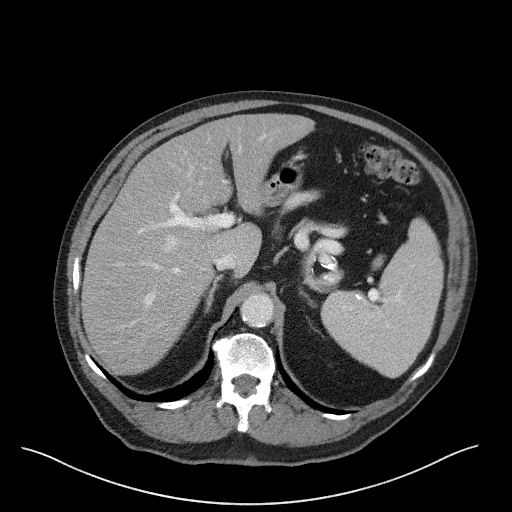
[im 97/103  soft-tissue]
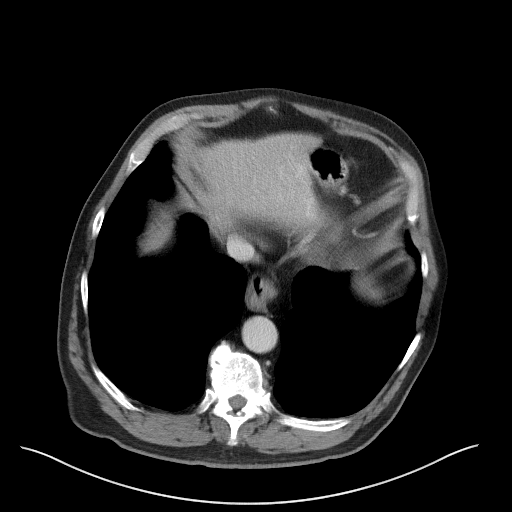

[Series 4: coronal st · coronal · 0.78mm/px · 3 of 106 slices shown]
[im 36/106  soft-tissue]
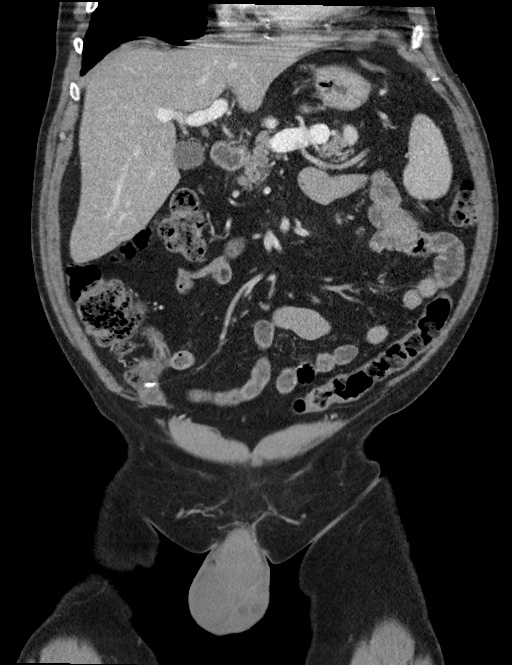
[im 47/106  soft-tissue]
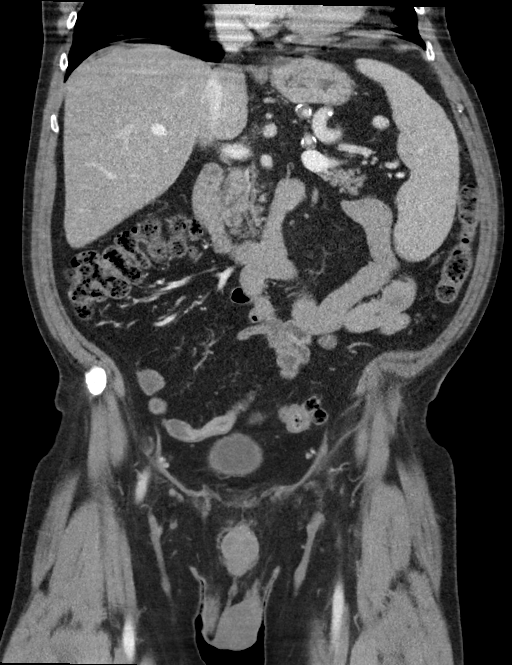
[im 59/106  soft-tissue]
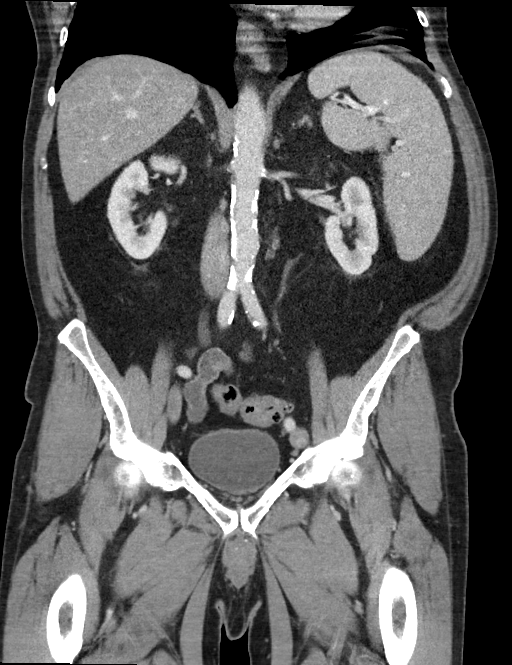

[16 of 46 positions shown; findings below may reference images not displayed]

FINDINGS: Lower chest: No acute abnormality.  Cardiomegaly.

Hepatobiliary: Liver is normal in size and contour. Stable 6 mm
hypodense likely cysts in the inferior right hepatic lobe. Multiple
gallstones visualized. No gallbladder wall thickening or
pericholecystic edema. No biliary ductal dilatation identified.

Pancreas: Unremarkable. No pancreatic ductal dilatation or
surrounding inflammatory changes.

Spleen: Enlarged measuring up to 17 cm in length.

Adrenals/Urinary Tract: Adrenal glands are unremarkable. Kidneys are
normal, without renal calculi, focal lesion, or hydronephrosis.
Bladder is unremarkable.

Stomach/Bowel: Small hiatal hernia. No bowel obstruction, free air
or pneumatosis. Colonic diverticulosis. No bowel wall edema
identified. Moderate to large amount of retained fecal material
throughout the colon. Appendectomy changes.

Vascular/Lymphatic: Aortic atherosclerosis. No enlarged abdominal or
pelvic lymph nodes.

Reproductive: Brachia therapy seeds in the prostate gland. Prostate
gland is mildly enlarged with mild indentation on the base of the
urinary bladder.

Other: No abdominal wall hernia or abnormality. No abdominopelvic
ascites.

Musculoskeletal: No acute or significant osseous findings.
IMPRESSION: 1. No acute process identified in the abdomen or pelvis.
2. Colonic diverticulosis. Retained fecal material throughout the
colon.
3. Splenomegaly.
4. Small hiatal hernia.
5. Cholelithiasis.

## 2022-01-31 DIAGNOSIS — J40 Bronchitis, not specified as acute or chronic: Secondary | ICD-10-CM | POA: Diagnosis not present

## 2022-01-31 DIAGNOSIS — J019 Acute sinusitis, unspecified: Secondary | ICD-10-CM | POA: Diagnosis not present

## 2022-01-31 DIAGNOSIS — Z03818 Encounter for observation for suspected exposure to other biological agents ruled out: Secondary | ICD-10-CM | POA: Diagnosis not present

## 2022-02-01 IMAGING — CT CT CHEST W/O CM
2 of 4 series · 15 of 36 positions shown, 18 images · non-contrast
Comparison: None.

CLINICAL DATA: Follow-up lung nodule.

EXAM:
CT CHEST WITHOUT CONTRAST
TECHNIQUE: Multidetector CT imaging of the chest was performed following the
standard protocol without IV contrast.

[Series 2: chest 2.00 · axial · 0.66mm/px · z∈[-1464,-1172]mm · 12 of 174 slices shown, 15 images]
[im 14/174  mediastinal]
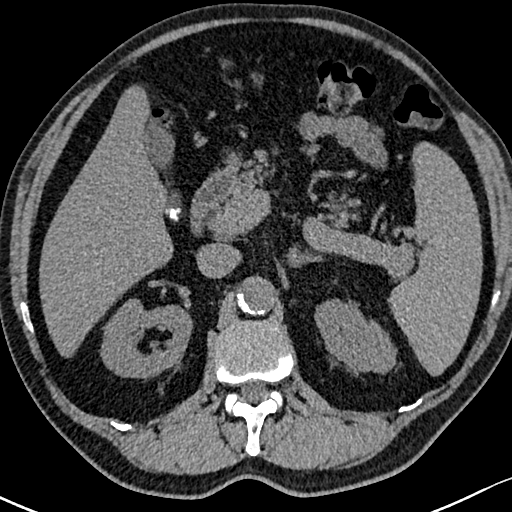
[im 14/174  lung]
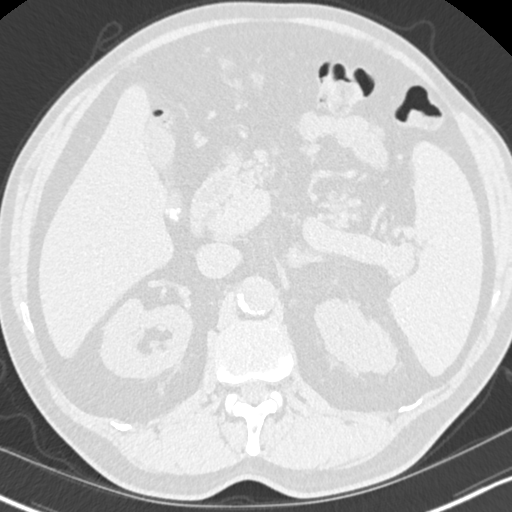
[im 27/174  lung]
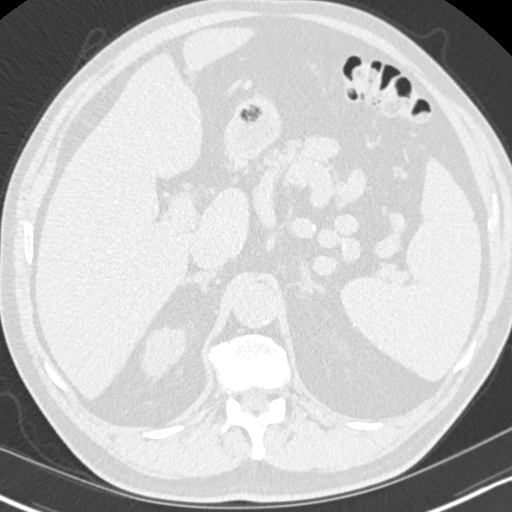
[im 40/174  lung]
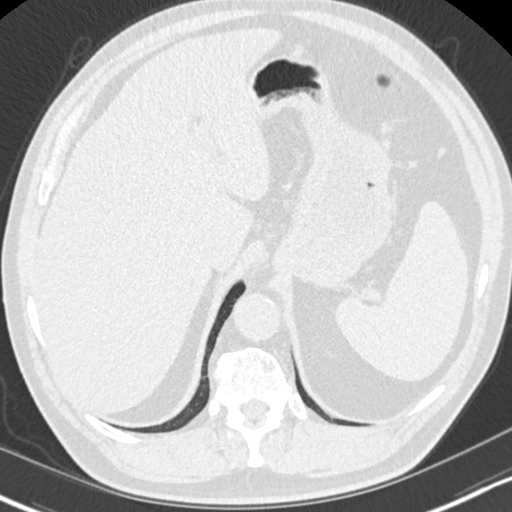
[im 54/174  lung]
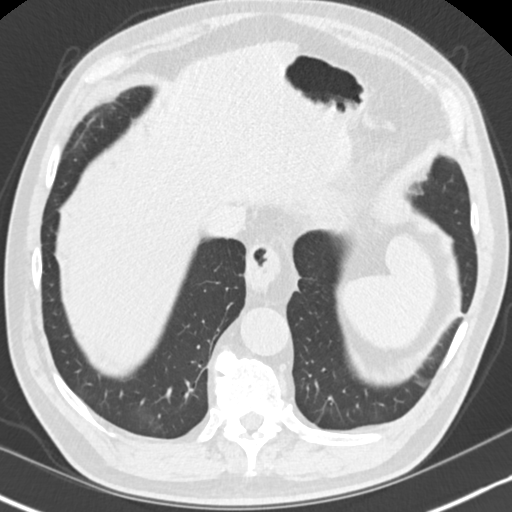
[im 67/174  mediastinal]
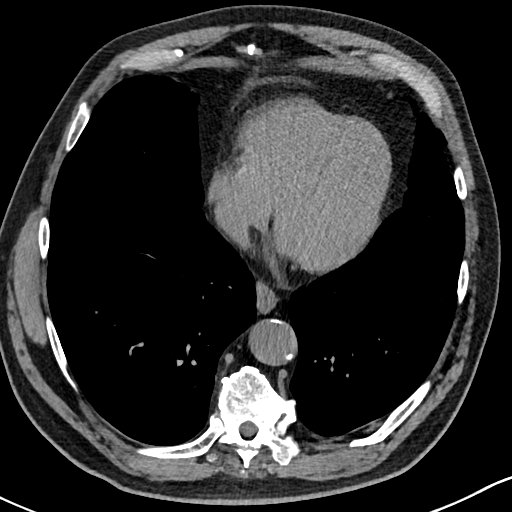
[im 67/174  lung]
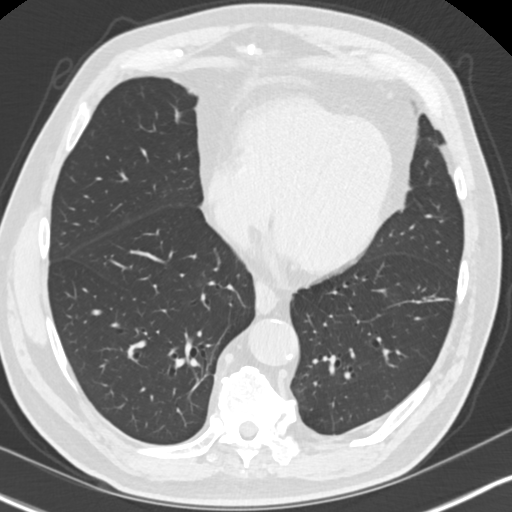
[im 80/174  lung]
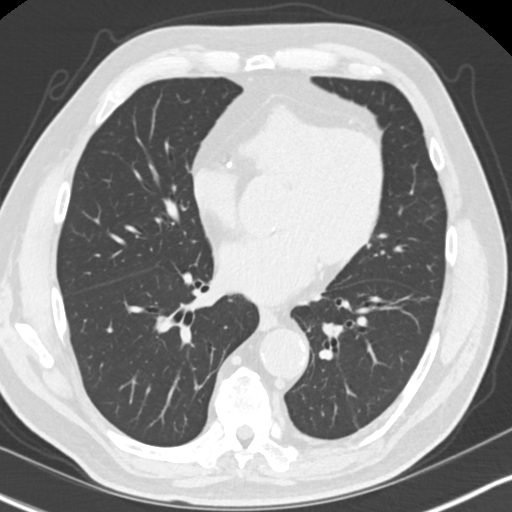
[im 94/174  lung]
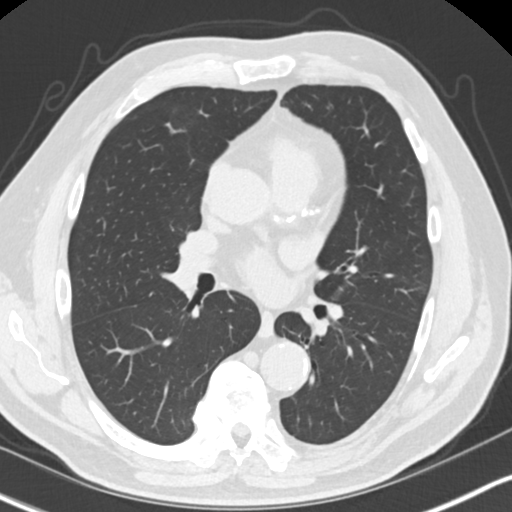
[im 107/174  lung]
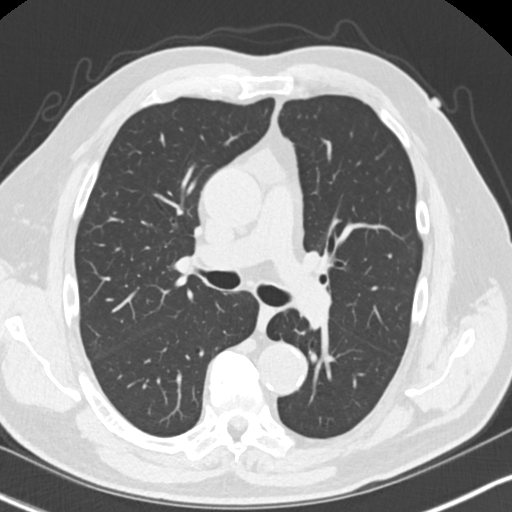
[im 120/174  mediastinal]
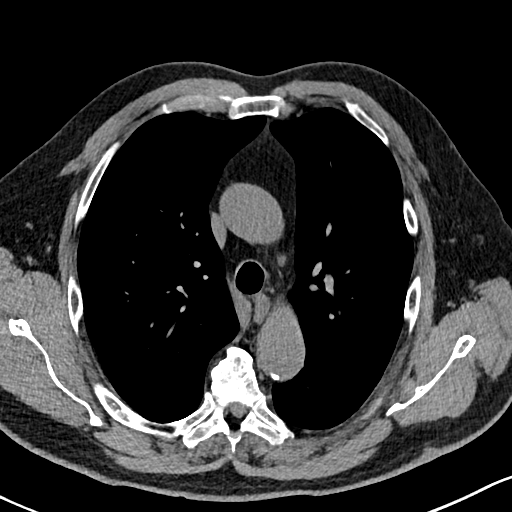
[im 120/174  lung]
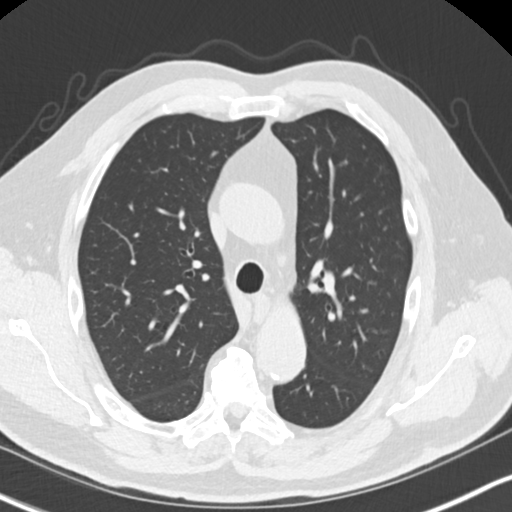
[im 134/174  lung]
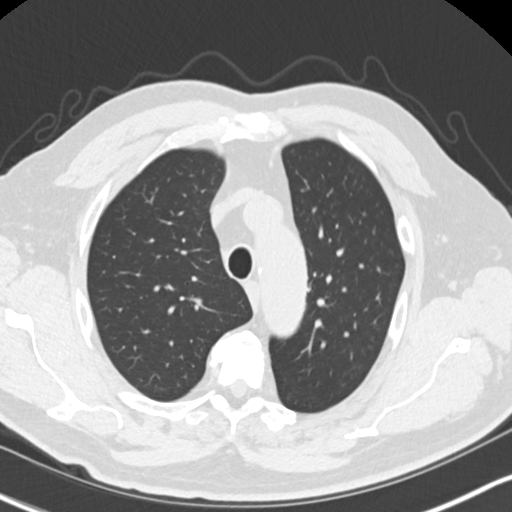
[im 147/174  lung]
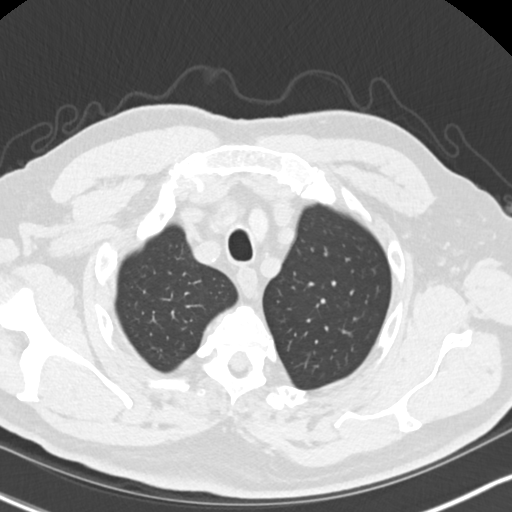
[im 160/174  lung]
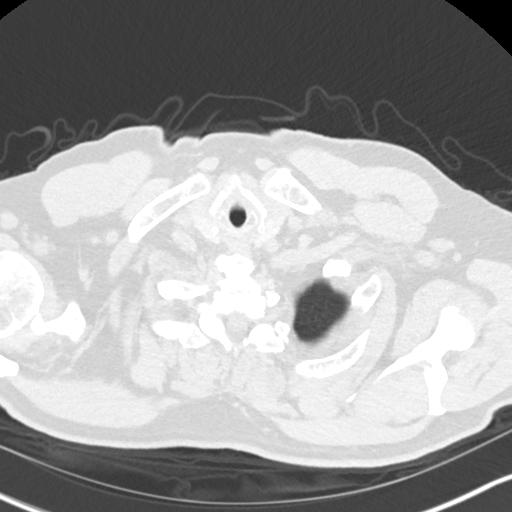

[Series 5: coronals chest 2.00 cor · coronal · 0.66mm/px · 3 of 166 slices shown]
[im 34/166  lung]
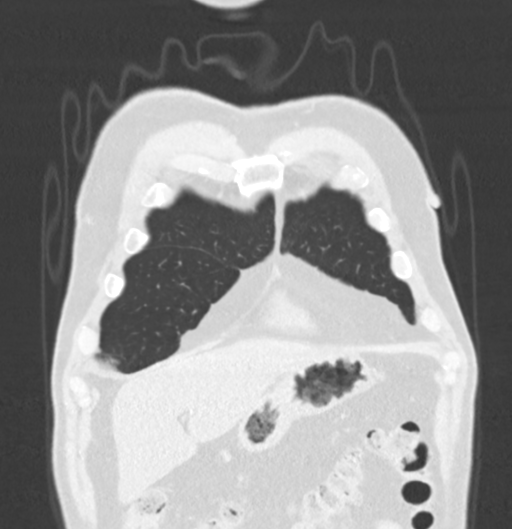
[im 67/166  lung]
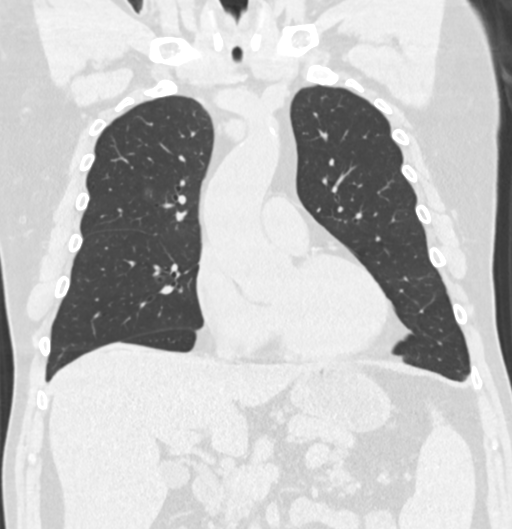
[im 100/166  lung]
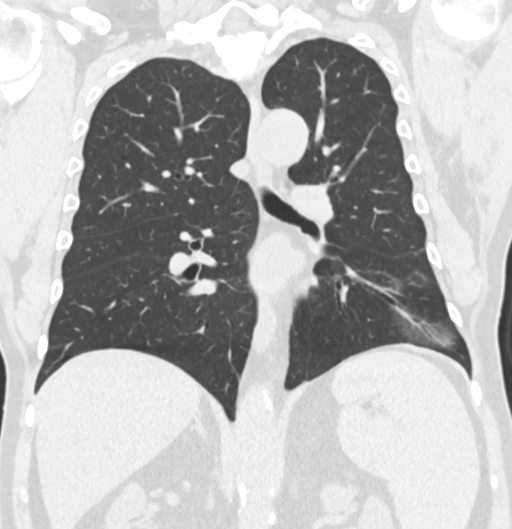

[15 of 36 positions shown; findings below may reference images not displayed]

FINDINGS: Evaluation of this exam is limited in the absence of intravenous
contrast.

Cardiovascular: There is no cardiomegaly or pericardial effusion.
There is 3 vessel coronary vascular calcification. Moderate
atherosclerotic calcification of the thoracic aorta. No aneurysmal
dilatation. The central pulmonary arteries are grossly unremarkable.

Mediastinum/Nodes: No hilar or mediastinal adenopathy. Small hiatal
hernia. The esophagus is grossly unremarkable. No mediastinal fluid
collection.

Lungs/Pleura: Faint ill-defined ground-glass focus in the right
upper lobe measures 5 mm (57/3) and 5 mm 63/3). These may represent
areas of atelectasis or interstitial crowding. No focal
consolidation, pleural effusion, or pneumothorax. The central
airways are patent.

Upper Abdomen: Multiple gallstones.

Musculoskeletal: Degenerative changes of the spine. No acute osseous
pathology.
IMPRESSION: 1. No acute intrathoracic pathology.
2. Faint ill-defined ground-glass nodules in the right upper lobe
measure up to 5 mm.
Non-contrast chest CT at 3-6 months is recommended. If nodules
persist and are stable at that time, consider additional
non-contrast chest CT examinations at 2 and 4 years. This
recommendation follows the consensus statement: Guidelines for
Management of Incidental Pulmonary Nodules Detected on CT Images:
3. Cholelithiasis.
4. Aortic Atherosclerosis (NZDHE-MOL.L).

## 2022-03-23 DIAGNOSIS — Z961 Presence of intraocular lens: Secondary | ICD-10-CM | POA: Diagnosis not present

## 2022-03-23 DIAGNOSIS — H353211 Exudative age-related macular degeneration, right eye, with active choroidal neovascularization: Secondary | ICD-10-CM | POA: Diagnosis not present

## 2022-03-23 DIAGNOSIS — H3554 Dystrophies primarily involving the retinal pigment epithelium: Secondary | ICD-10-CM | POA: Diagnosis not present

## 2022-05-10 IMAGING — CT CT CHEST W/O CM
2 of 4 series · 15 of 36 positions shown, 18 images · non-contrast
Comparison: 01/13/2021

CLINICAL DATA: Pulmonary nodule. Follow-up examination. Prostate
cancer.



[Series 2: chest 2.00 · axial · 0.66mm/px · z∈[-1205,-921]mm · 12 of 168 slices shown, 15 images]
[im 13/168  mediastinal]
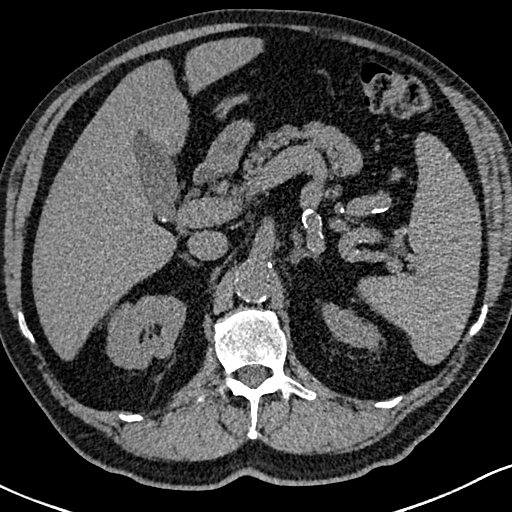
[im 13/168  lung]
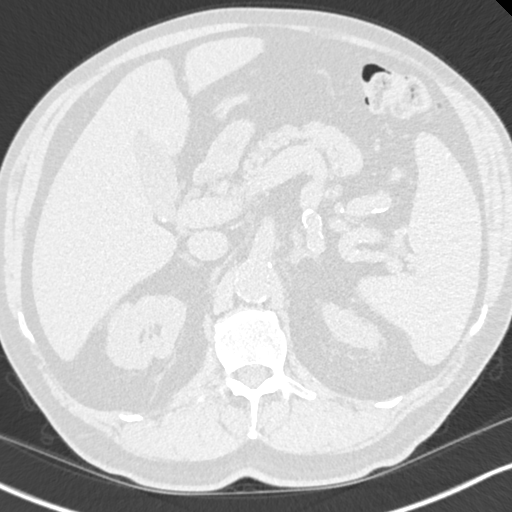
[im 26/168  lung]
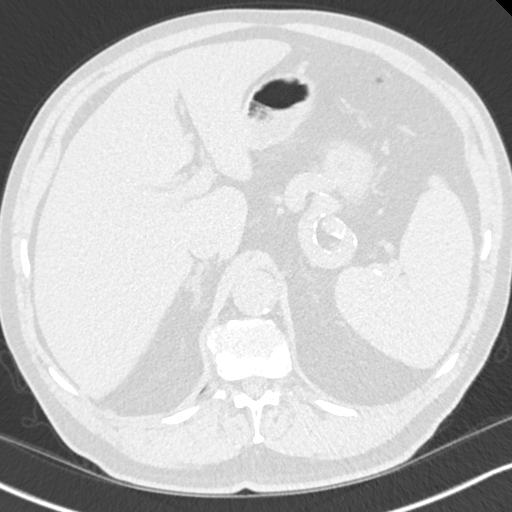
[im 39/168  lung]
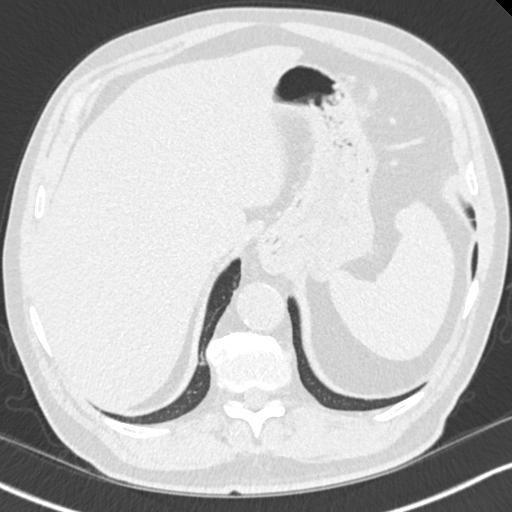
[im 52/168  lung]
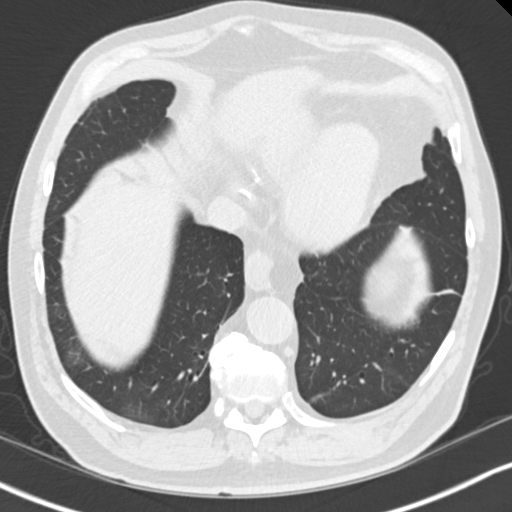
[im 65/168  mediastinal]
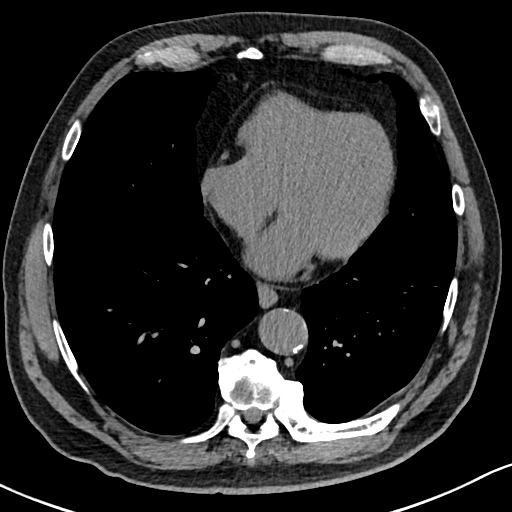
[im 65/168  lung]
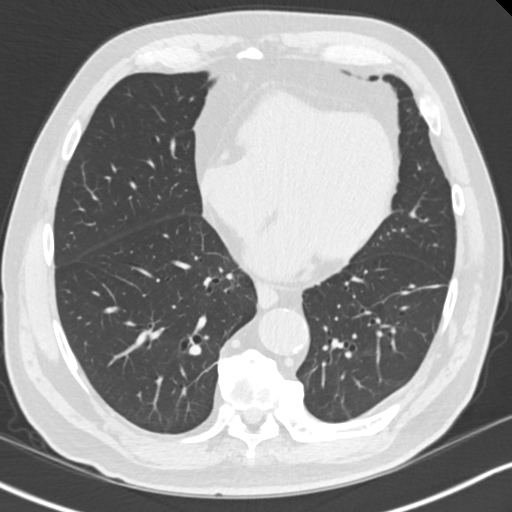
[im 78/168  lung]
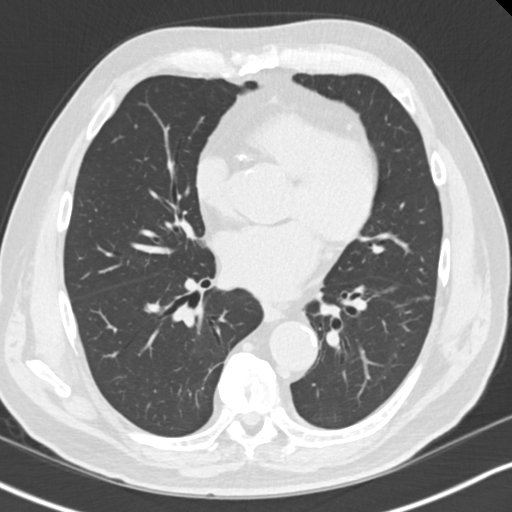
[im 90/168  lung]
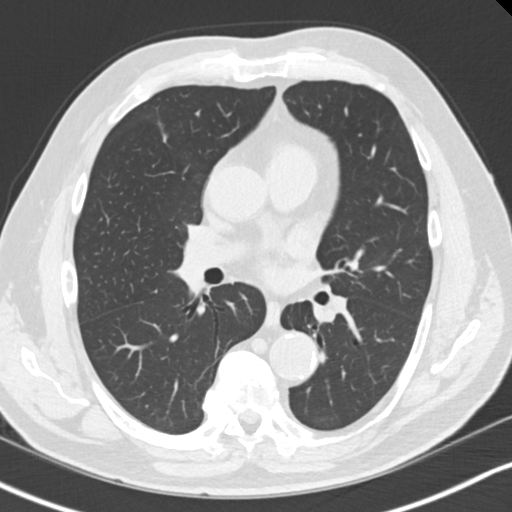
[im 103/168  lung]
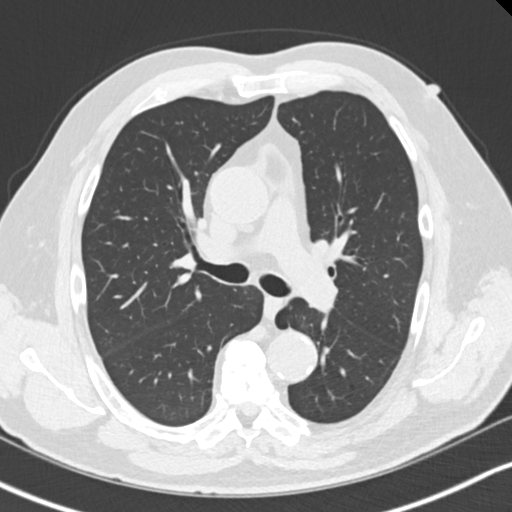
[im 116/168  mediastinal]
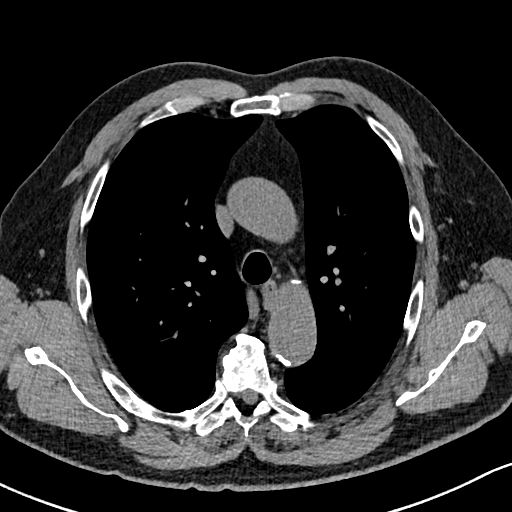
[im 116/168  lung]
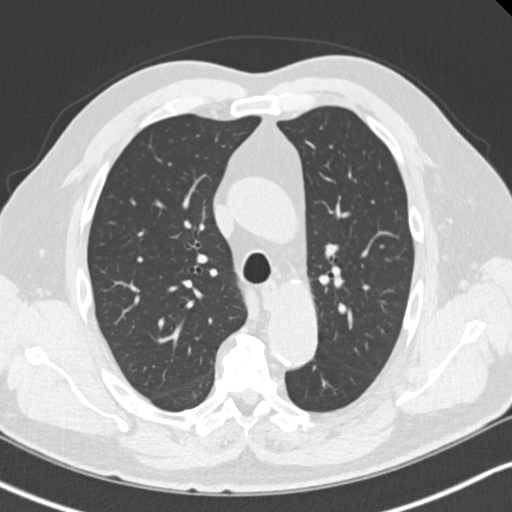
[im 129/168  lung]
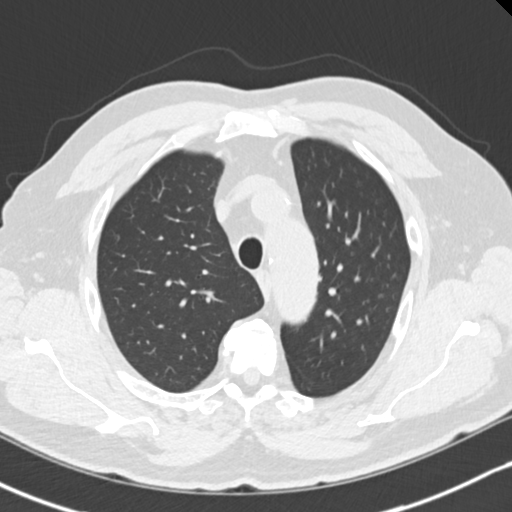
[im 142/168  lung]
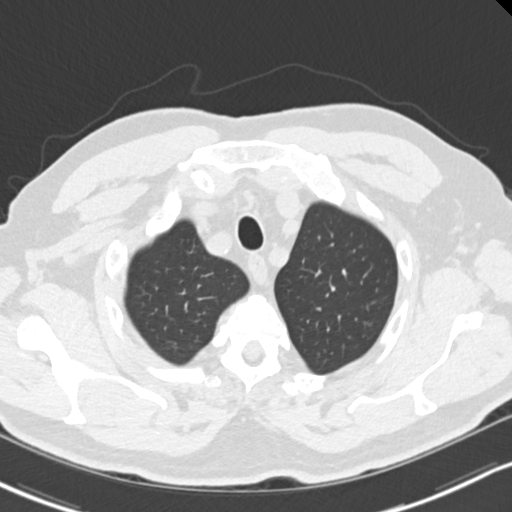
[im 155/168  lung]
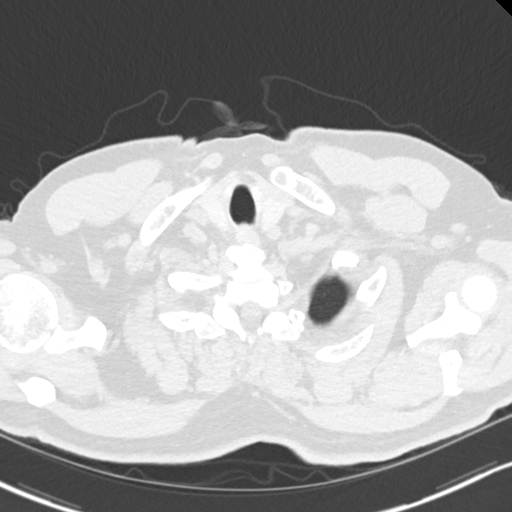

[Series 5: coronals chest 2.00 cor · coronal · 0.66mm/px · 3 of 165 slices shown]
[im 33/165  lung]
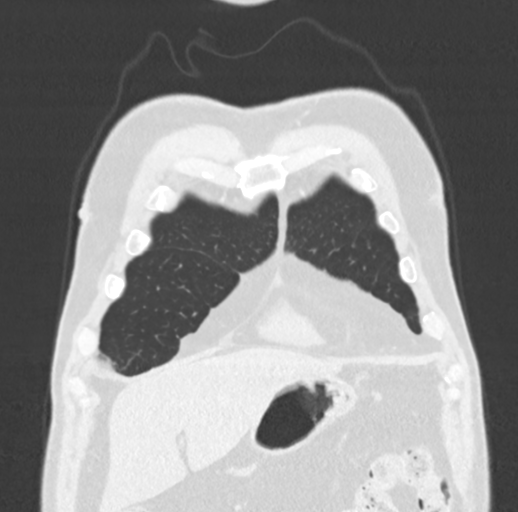
[im 66/165  lung]
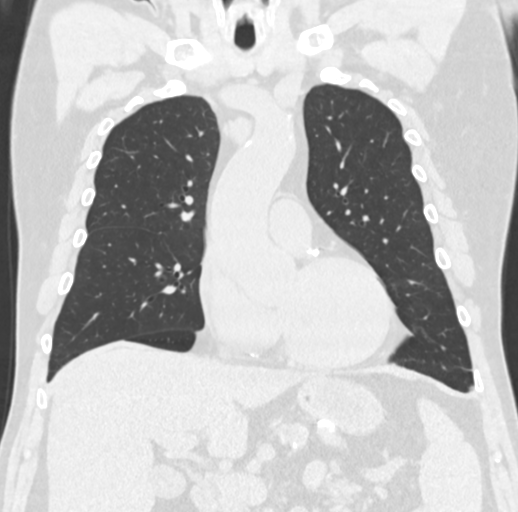
[im 99/165  lung]
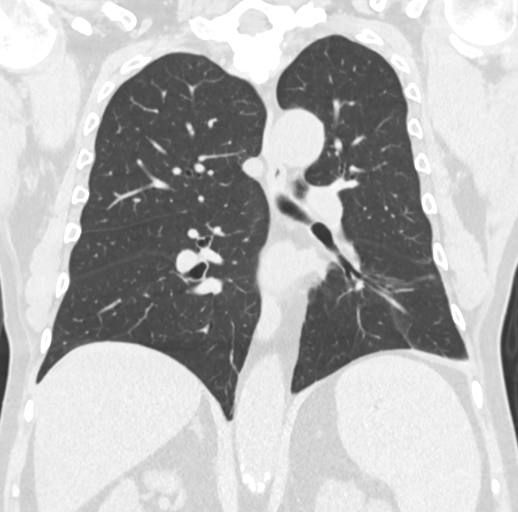

[15 of 36 positions shown; findings below may reference images not displayed]

FINDINGS: Cardiovascular: Extensive multi-vessel coronary artery
calcification. Global cardiac size within normal limits. No
pericardial effusion. Central pulmonary arteries are of normal
caliber. Moderate atherosclerotic calcification within the thoracic
aorta. No aortic aneurysm.

Mediastinum/Nodes: Visualized thyroid is unremarkable. No pathologic
thoracic adenopathy. Esophagus unremarkable. Small hiatal hernia.

Lungs/Pleura: Previously noted ground-glass pulmonary nodules within
the right upper lobe are no longer visualized in keeping with a
resolved infectious or inflammatory process on the prior
examination. The lungs are clear. No pneumothorax or pleural
effusion. Central airways are widely patent.

Upper Abdomen: Cholelithiasis. There is mild splenomegaly with the
spleen measuring at least 16.2 cm in greatest dimension. The splenic
artery appears enlarged in this may, together, reflect changes of
hypersplenism though this is not well characterized on this
examination. No acute abnormality.

Musculoskeletal: No acute bone abnormality. No lytic or blastic bone
lesion.
IMPRESSION: Resolved ground-glass nodules within the right upper lobe in keeping
with any resolved infectious or inflammatory process on the prior
examination.

Extensive multi-vessel coronary artery calcification.

Cholelithiasis.

Splenomegaly, stable since prior examination though incompletely
assessed on this exam. Relative enlargement of the splenic artery is
also noted and, together, these changes may reflect hypersplenism.

Aortic Atherosclerosis (SI0QA-2L5.5).

## 2022-05-25 DIAGNOSIS — E7800 Pure hypercholesterolemia, unspecified: Secondary | ICD-10-CM | POA: Diagnosis not present

## 2022-05-25 DIAGNOSIS — D472 Monoclonal gammopathy: Secondary | ICD-10-CM | POA: Diagnosis not present

## 2022-05-25 DIAGNOSIS — E538 Deficiency of other specified B group vitamins: Secondary | ICD-10-CM | POA: Diagnosis not present

## 2022-05-25 DIAGNOSIS — D649 Anemia, unspecified: Secondary | ICD-10-CM | POA: Diagnosis not present

## 2022-06-01 DIAGNOSIS — D472 Monoclonal gammopathy: Secondary | ICD-10-CM | POA: Diagnosis not present

## 2022-06-01 DIAGNOSIS — D649 Anemia, unspecified: Secondary | ICD-10-CM | POA: Diagnosis not present

## 2022-06-01 DIAGNOSIS — E538 Deficiency of other specified B group vitamins: Secondary | ICD-10-CM | POA: Diagnosis not present

## 2022-06-01 DIAGNOSIS — C61 Malignant neoplasm of prostate: Secondary | ICD-10-CM | POA: Diagnosis not present

## 2022-06-01 DIAGNOSIS — Z0000 Encounter for general adult medical examination without abnormal findings: Secondary | ICD-10-CM | POA: Diagnosis not present

## 2022-06-01 DIAGNOSIS — D696 Thrombocytopenia, unspecified: Secondary | ICD-10-CM | POA: Diagnosis not present

## 2022-06-01 DIAGNOSIS — E7800 Pure hypercholesterolemia, unspecified: Secondary | ICD-10-CM | POA: Diagnosis not present

## 2022-06-01 DIAGNOSIS — K219 Gastro-esophageal reflux disease without esophagitis: Secondary | ICD-10-CM | POA: Diagnosis not present

## 2022-06-03 DIAGNOSIS — H3589 Other specified retinal disorders: Secondary | ICD-10-CM | POA: Diagnosis not present

## 2022-06-03 DIAGNOSIS — H35373 Puckering of macula, bilateral: Secondary | ICD-10-CM | POA: Diagnosis not present

## 2022-06-03 DIAGNOSIS — H353131 Nonexudative age-related macular degeneration, bilateral, early dry stage: Secondary | ICD-10-CM | POA: Diagnosis not present

## 2022-07-07 DIAGNOSIS — R1013 Epigastric pain: Secondary | ICD-10-CM | POA: Diagnosis not present

## 2022-07-07 DIAGNOSIS — Z1211 Encounter for screening for malignant neoplasm of colon: Secondary | ICD-10-CM | POA: Diagnosis not present

## 2022-09-30 ENCOUNTER — Encounter: Payer: Self-pay | Admitting: *Deleted

## 2022-10-03 ENCOUNTER — Ambulatory Visit: Payer: PPO | Admitting: Certified Registered"

## 2022-10-03 ENCOUNTER — Other Ambulatory Visit: Payer: Self-pay | Admitting: Gastroenterology

## 2022-10-03 ENCOUNTER — Encounter
Admission: RE | Disposition: A | Discharge status: Home / Self Care | Payer: Self-pay | Source: Ambulatory Visit | Attending: Gastroenterology

## 2022-10-03 ENCOUNTER — Ambulatory Visit
Admission: RE | Admit: 2022-10-03 | Discharge: 2022-10-03 | Disposition: A | Discharge status: Home / Self Care | Payer: PPO | Source: Ambulatory Visit | Attending: Gastroenterology | Admitting: Gastroenterology

## 2022-10-03 DIAGNOSIS — R161 Splenomegaly, not elsewhere classified: Secondary | ICD-10-CM | POA: Diagnosis not present

## 2022-10-03 DIAGNOSIS — D122 Benign neoplasm of ascending colon: Secondary | ICD-10-CM | POA: Insufficient documentation

## 2022-10-03 DIAGNOSIS — R1013 Epigastric pain: Secondary | ICD-10-CM | POA: Diagnosis present

## 2022-10-03 DIAGNOSIS — K5790 Diverticulosis of intestine, part unspecified, without perforation or abscess without bleeding: Secondary | ICD-10-CM | POA: Diagnosis not present

## 2022-10-03 DIAGNOSIS — K2970 Gastritis, unspecified, without bleeding: Secondary | ICD-10-CM | POA: Diagnosis not present

## 2022-10-03 DIAGNOSIS — K2950 Unspecified chronic gastritis without bleeding: Secondary | ICD-10-CM | POA: Diagnosis not present

## 2022-10-03 DIAGNOSIS — Z1211 Encounter for screening for malignant neoplasm of colon: Secondary | ICD-10-CM | POA: Diagnosis not present

## 2022-10-03 DIAGNOSIS — D123 Benign neoplasm of transverse colon: Secondary | ICD-10-CM | POA: Insufficient documentation

## 2022-10-03 DIAGNOSIS — B9681 Helicobacter pylori [H. pylori] as the cause of diseases classified elsewhere: Secondary | ICD-10-CM | POA: Insufficient documentation

## 2022-10-03 DIAGNOSIS — D696 Thrombocytopenia, unspecified: Secondary | ICD-10-CM | POA: Diagnosis not present

## 2022-10-03 DIAGNOSIS — K219 Gastro-esophageal reflux disease without esophagitis: Secondary | ICD-10-CM | POA: Diagnosis not present

## 2022-10-03 DIAGNOSIS — K760 Fatty (change of) liver, not elsewhere classified: Secondary | ICD-10-CM | POA: Insufficient documentation

## 2022-10-03 DIAGNOSIS — K449 Diaphragmatic hernia without obstruction or gangrene: Secondary | ICD-10-CM | POA: Diagnosis not present

## 2022-10-03 DIAGNOSIS — K5730 Diverticulosis of large intestine without perforation or abscess without bleeding: Secondary | ICD-10-CM | POA: Diagnosis not present

## 2022-10-03 DIAGNOSIS — K640 First degree hemorrhoids: Secondary | ICD-10-CM | POA: Diagnosis not present

## 2022-10-03 DIAGNOSIS — Z87891 Personal history of nicotine dependence: Secondary | ICD-10-CM | POA: Diagnosis not present

## 2022-10-03 DIAGNOSIS — K635 Polyp of colon: Secondary | ICD-10-CM | POA: Diagnosis not present

## 2022-10-03 HISTORY — DX: Splenomegaly, not elsewhere classified: R16.1

## 2022-10-03 HISTORY — PX: ESOPHAGOGASTRODUODENOSCOPY (EGD) WITH PROPOFOL: SHX5813

## 2022-10-03 HISTORY — PX: POLYPECTOMY: SHX5525

## 2022-10-03 HISTORY — PX: BIOPSY: SHX5522

## 2022-10-03 HISTORY — PX: COLONOSCOPY WITH PROPOFOL: SHX5780

## 2022-10-03 SURGERY — COLONOSCOPY WITH PROPOFOL
Anesthesia: General

## 2022-10-03 MED ORDER — PROPOFOL 500 MG/50ML IV EMUL
INTRAVENOUS | Status: DC | PRN
  Administered 2022-10-03: 180 ug/kg/min via INTRAVENOUS

## 2022-10-03 MED ORDER — PROPOFOL 10 MG/ML IV BOLUS
INTRAVENOUS | Status: DC | PRN
  Administered 2022-10-03: 100 mg via INTRAVENOUS

## 2022-10-03 MED ORDER — LIDOCAINE HCL (CARDIAC) PF 100 MG/5ML IV SOSY
PREFILLED_SYRINGE | INTRAVENOUS | Status: DC | PRN
  Administered 2022-10-03: 100 mg via INTRAVENOUS
  Administered 2022-10-03: 50 mg via INTRAVENOUS

## 2022-10-03 MED ORDER — SEVOFLURANE IN SOLN
RESPIRATORY_TRACT | Status: AC
  Filled 2022-10-03: qty 250

## 2022-10-03 MED ORDER — SODIUM CHLORIDE 0.9 % IV SOLN
INTRAVENOUS | Status: DC
  Administered 2022-10-03: 20 mL/h via INTRAVENOUS

## 2022-10-03 NOTE — H&P (Signed)
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 No blood thinners. No family history of GI malignancies. History of appendectomy.    Current Facility-Administered Medications:    0.9 %  sodium chloride infusion, , Intravenous, Continuous, , Rossie Muskrat, MD, Last Rate: 20 mL/hr at 10/03/22 0945, 20 mL/hr at 10/03/22 0945  Medications Prior to Admission  Medication Sig Dispense Refill Last Dose   fluticasone (FLONASE) 50 MCG/ACT nasal spray Place into the nose.   Past Week   omeprazole (PRILOSEC) 10 MG capsule Take by mouth.   Past Week   vitamin B-12 (CYANOCOBALAMIN) 1000 MCG tablet Take 1,000 mcg by mouth daily.   Past Week     No Known Allergies   Past Medical History:  Diagnosis Date   Anemia    GERD (gastroesophageal reflux disease)    History of splenomegaly    Irritable bowel syndrome    Lipoma    MGUS (monoclonal gammopathy of unknown significance)    Prostate cancer (HCC)    Splenomegaly    Thrombocytopenia (HCC)     Review of systems:  Otherwise negative.    Physical Exam  Gen: Alert, oriented. Appears stated age.  HEENT: PERRLA. Lungs: No respiratory distress CV: RRR Abd: soft, benign, no masses Ext: No edema    Planned procedures: Proceed with EGD/colonoscopy. The patient understands the nature of the planned procedure, indications, risks, alternatives and potential complications including but not limited to bleeding, infection, perforation, damage to internal organs and possible oversedation/side effects from anesthesia. The patient agrees and gives consent to proceed.  Please refer to procedure notes for findings, recommendations and  patient disposition/instructions.     Regis Bill, MD [facility]

## 2022-10-03 NOTE — Op Note (Signed)
[facility] Gastroenterology Patient Name: Anthony Gregory Procedure Date: 10/03/2022 9:37 AM MRN: [id number] Account #: [id number] Date of Birth: 22-Nov-1945 Admit Type: Outpatient Age: 77 Room: [facility] ENDO ROOM 3 Gender: Male Note Status: Finalized Instrument Name: Upper Endoscope [id number] Procedure:             Upper GI endoscopy Indications:           Dyspepsia Providers:             Eather Colas MD, MD Referring MD:          Daniel Nones, MD (Referring MD) Medicines:             Monitored Anesthesia Care Complications:         No immediate complications. Estimated blood loss:                         Minimal. Procedure:             Pre-Anesthesia Assessment:                        - Prior to the procedure, a History and Physical was                         performed, and patient medications and allergies were                         reviewed. The patient is competent. The risks and                         benefits of the procedure and the sedation options and                         risks were discussed with the patient. All questions                         were answered and informed consent was obtained.                         Patient identification and proposed procedure were                         verified by the physician, the nurse, the                         anesthesiologist, the anesthetist and the technician                         in the endoscopy suite. Mental Status Examination:                         alert and oriented. Airway Examination: normal                         oropharyngeal airway and neck mobility. Respiratory                         Examination: clear to auscultation. CV Examination:  normal. Prophylactic Antibiotics: The patient does not                         require prophylactic antibiotics. Prior                         Anticoagulants: The patient has taken no anticoagulant                         or  antiplatelet agents. ASA Grade Assessment: II - A                         patient with mild systemic disease. After reviewing                         the risks and benefits, the patient was deemed in                         satisfactory condition to undergo the procedure. The                         anesthesia plan was to use monitored anesthesia care                         (MAC). Immediately prior to administration of                         medications, the patient was re-assessed for adequacy                         to receive sedatives. The heart rate, respiratory                         rate, oxygen saturations, blood pressure, adequacy of                         pulmonary ventilation, and response to care were                         monitored throughout the procedure. The physical                         status of the patient was re-assessed after the                         procedure.                        After obtaining informed consent, the endoscope was                         passed under direct vision. Throughout the procedure,                         the patient's blood pressure, pulse, and oxygen                         saturations were monitored continuously. The Endoscope  was introduced through the mouth, and advanced to the                         second part of duodenum. The upper GI endoscopy was                         accomplished without difficulty. The patient tolerated                         the procedure well. Findings:      A 3 cm hiatal hernia was present.      The exam of the esophagus was otherwise normal.      The entire examined stomach was normal. Biopsies were taken with a cold       forceps for Helicobacter pylori testing. Estimated blood loss was       minimal.      The examined duodenum was normal. Impression:            - 3 cm hiatal hernia.                        - Normal stomach. Biopsied.                        -  Normal examined duodenum. Recommendation:        - Discharge patient to home.                        - Resume previous diet.                        - Continue present medications.                        - Await pathology results.                        - Return to referring physician as previously                         scheduled. Procedure Code(s):     --- Professional ---                        [phone], Esophagogastroduodenoscopy, flexible,                         transoral; with biopsy, single or multiple Diagnosis Code(s):     --- Professional ---                        K44.9, Diaphragmatic hernia without obstruction or                         gangrene                        R10.13, Epigastric pain CPT copyright 2022 American Medical Association. All rights reserved. The codes documented in this report are preliminary and upon coder review may  be revised to meet current compliance requirements. Eather Colas MD, MD 10/03/2022 10:25:38 AM Number of Addenda: 0 Note Initiated On: 10/03/2022 9:37 AM Estimated Blood Loss:  Estimated blood loss was minimal.      [facility]

## 2022-10-03 NOTE — Anesthesia Postprocedure Evaluation (Signed)
Anesthesia Post Note  Patient: TEVITA KORZENIEWSKI  Procedure(s) Performed: COLONOSCOPY WITH PROPOFOL ESOPHAGOGASTRODUODENOSCOPY (EGD) WITH PROPOFOL BIOPSY POLYPECTOMY  Patient location during evaluation: Endoscopy Anesthesia Type: General Level of consciousness: awake and alert Pain management: pain level controlled Vital Signs Assessment: post-procedure vital signs reviewed and stable Respiratory status: spontaneous breathing, nonlabored ventilation and respiratory function stable Cardiovascular status: blood pressure returned to baseline and stable Postop Assessment: no apparent nausea or vomiting Anesthetic complications: no   No notable events documented.   Last Vitals:  Vitals:   10/03/22 1034 10/03/22 1044  BP: 126/75 (!) 132/94  Pulse: 81 76  Resp: 16 15  Temp:    SpO2: 97% 98%    Last Pain:  Vitals:   10/03/22 1044  TempSrc:   PainSc: 0-No pain                 Foye Deer

## 2022-10-03 NOTE — Interval H&P Note (Signed)
History and Physical Interval Note:  10/03/2022 9:49 AM  Anthony Gregory  has presented today for surgery, with the diagnosis of DYSPEPSIA,CCA SCREEN.  The various methods of treatment have been discussed with the patient and family. After consideration of risks, benefits and other options for treatment, the patient has consented to  Procedure(s): COLONOSCOPY WITH PROPOFOL (N/A) ESOPHAGOGASTRODUODENOSCOPY (EGD) WITH PROPOFOL (N/A) as a surgical intervention.  The patient's history has been reviewed, patient examined, no change in status, stable for surgery.  I have reviewed the patient's chart and labs.  Questions were answered to the patient's satisfaction.     Regis Bill  Ok to proceed with EGD/Colonoscopy

## 2022-10-03 NOTE — Op Note (Signed)
[facility] Gastroenterology Patient Name: Anthony Gregory Procedure Date: 10/03/2022 9:36 AM MRN: [id number] Account #: [id number] Date of Birth: 04/29/1945 Admit Type: Outpatient Age: 77 Room: [facility] ENDO ROOM 3 Gender: Male Note Status: Finalized Instrument Name: Nelda Marseille [id number] Procedure:             Colonoscopy Indications:           Screening for colorectal malignant neoplasm Providers:             Eather Colas MD, MD Referring MD:          Daniel Nones, MD (Referring MD) Medicines:             Monitored Anesthesia Care Complications:         No immediate complications. Estimated blood loss:                         Minimal. Procedure:             Pre-Anesthesia Assessment:                        - Prior to the procedure, a History and Physical was                         performed, and patient medications and allergies were                         reviewed. The patient is competent. The risks and                         benefits of the procedure and the sedation options and                         risks were discussed with the patient. All questions                         were answered and informed consent was obtained.                         Patient identification and proposed procedure were                         verified by the physician, the nurse, the                         anesthesiologist, the anesthetist and the technician                         in the endoscopy suite. Mental Status Examination:                         alert and oriented. Airway Examination: normal                         oropharyngeal airway and neck mobility. Respiratory                         Examination: clear to auscultation. CV Examination:  normal. Prophylactic Antibiotics: The patient does not                         require prophylactic antibiotics. Prior                         Anticoagulants: The patient has taken no anticoagulant                          or antiplatelet agents. ASA Grade Assessment: II - A                         patient with mild systemic disease. After reviewing                         the risks and benefits, the patient was deemed in                         satisfactory condition to undergo the procedure. The                         anesthesia plan was to use monitored anesthesia care                         (MAC). Immediately prior to administration of                         medications, the patient was re-assessed for adequacy                         to receive sedatives. The heart rate, respiratory                         rate, oxygen saturations, blood pressure, adequacy of                         pulmonary ventilation, and response to care were                         monitored throughout the procedure. The physical                         status of the patient was re-assessed after the                         procedure.                        After obtaining informed consent, the colonoscope was                         passed under direct vision. Throughout the procedure,                         the patient's blood pressure, pulse, and oxygen                         saturations were monitored continuously. The  Colonoscope was introduced through the anus and                         advanced to the the cecum, identified by appendiceal                         orifice and ileocecal valve. The colonoscopy was                         performed without difficulty. The patient tolerated                         the procedure well. The quality of the bowel                         preparation was adequate to identify polyps. The                         ileocecal valve, appendiceal orifice, and rectum were                         photographed. Findings:      The perianal and digital rectal examinations were normal.      A 2 mm polyp was found in the ascending colon. The polyp was sessile.        The polyp was removed with a jumbo cold forceps. Resection and retrieval       were complete. Estimated blood loss was minimal.      A 1 mm polyp was found in the transverse colon. The polyp was sessile.       The polyp was removed with a jumbo cold forceps. Resection and retrieval       were complete. Estimated blood loss was minimal.      Two sessile polyps were found in the distal transverse colon. The polyps       were 2 to 3 mm in size. These polyps were removed with a cold snare.       Resection and retrieval were complete. Estimated blood loss was minimal.      Multiple large-mouthed and small-mouthed diverticula were found in the       sigmoid colon.      Internal hemorrhoids were found during retroflexion. The hemorrhoids       were Grade I (internal hemorrhoids that do not prolapse).      The exam was otherwise without abnormality on direct and retroflexion       views. Impression:            - One 2 mm polyp in the ascending colon, removed with                         a jumbo cold forceps. Resected and retrieved.                        - One 1 mm polyp in the transverse colon, removed with                         a jumbo cold forceps. Resected and retrieved.                        -  Two 2 to 3 mm polyps in the distal transverse colon,                         removed with a cold snare. Resected and retrieved.                        - Diverticulosis in the sigmoid colon.                        - Internal hemorrhoids.                        - The examination was otherwise normal on direct and                         retroflexion views. Recommendation:        - Discharge patient to home.                        - Resume previous diet.                        - Continue present medications.                        - Await pathology results.                        - Repeat colonoscopy in 3 - 5 years for surveillance                         based on clinical status at that  time.                        - Return to referring physician as previously                         scheduled. Procedure Code(s):     --- Professional ---                        [phone], Colonoscopy, flexible; with removal of                         tumor(s), polyp(s), or other lesion(s) by snare                         technique                        45380, 59, Colonoscopy, flexible; with biopsy, single                         or multiple Diagnosis Code(s):     --- Professional ---                        Z12.11, Encounter for screening for malignant neoplasm                         of colon  D12.2, Benign neoplasm of ascending colon                        D12.3, Benign neoplasm of transverse colon (hepatic                         flexure or splenic flexure)                        K64.0, First degree hemorrhoids                        K57.30, Diverticulosis of large intestine without                         perforation or abscess without bleeding CPT copyright 2022 American Medical Association. All rights reserved. The codes documented in this report are preliminary and upon coder review may  be revised to meet current compliance requirements. Eather Colas MD, MD 10/03/2022 10:29:33 AM Number of Addenda: 0 Note Initiated On: 10/03/2022 9:36 AM Scope Withdrawal Time: 0 hours 13 minutes 37 seconds  Total Procedure Duration: 0 hours 17 minutes 19 seconds  Estimated Blood Loss:  Estimated blood loss was minimal.      [facility]

## 2022-10-03 NOTE — Anesthesia Preprocedure Evaluation (Addendum)
Anesthesia Evaluation  Patient identified by MRN, date of birth, ID band Patient awake    Reviewed: Allergy & Precautions, H&P , NPO status , Patient's Chart, lab work & pertinent test results  Airway Mallampati: III  TM Distance: >3 FB Neck ROM: full    Dental no notable dental hx.    Pulmonary former smoker   Pulmonary exam normal        Cardiovascular negative cardio ROS Normal cardiovascular exam     Neuro/Psych negative neurological ROS  negative psych ROS   GI/Hepatic ,GERD  Controlled and Medicated,,Hepatic steatosis.   Stable splenomegaly.    Endo/Other  negative endocrine ROS    Renal/GU negative Renal ROS  negative genitourinary   Musculoskeletal   Abdominal   Peds  Hematology  (+) Blood dyscrasia thrombocytopenia Stable splenomegaly.     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) No date: Prostate cancer (HCC) No date: Splenomegaly No date: Thrombocytopenia (HCC)  Past Surgical History: No date: APPENDECTOMY 01/2011: Brachytherapy     Comment:  Placement of beads for prostate cancer 07/20/2021: CATARACT EXTRACTION W/PHACO; Right     Comment:  Procedure: CATARACT EXTRACTION PHACO AND INTRAOCULAR               LENS PLACEMENT (IOC) RIGHT 8.07 00:48.4;  Surgeon:               Galen Manila, MD;  Location: [facility];                Service: Ophthalmology;  Laterality: Right; 08/03/2021: CATARACT EXTRACTION W/PHACO; Left     Comment:  Procedure: CATARACT EXTRACTION PHACO AND INTRAOCULAR               LENS PLACEMENT (IOC) LEFT;  Surgeon: Galen Manila,               MD;  Location: [facility];  Service:               Ophthalmology;  Laterality: Left;   8.78 1:00.4 02/21/2019: LAPAROSCOPIC APPENDECTOMY; N/A     Comment:  Procedure: APPENDECTOMY LAPAROSCOPIC;  Surgeon: Duanne Guess, MD;  Location: [facility];  Service: General;                Laterality: N/A;     Reproductive/Obstetrics negative OB ROS                             Anesthesia Physical Anesthesia Plan  ASA: 2  Anesthesia Plan: General   Post-op Pain Management: Minimal or no pain anticipated   Induction: Intravenous  PONV Risk Score and Plan: Propofol infusion and TIVA  Airway Management Planned: Natural Airway  Additional Equipment:   Intra-op Plan:   Post-operative Plan:   Informed Consent: I have reviewed the patients History and Physical, chart, labs and discussed the procedure including the risks, benefits and alternatives for the proposed anesthesia with the patient or authorized representative who has indicated his/her understanding and acceptance.     Dental Advisory Given  Plan Discussed with: CRNA and Surgeon  Anesthesia Plan Comments:         Anesthesia Quick Evaluation

## 2022-10-03 NOTE — Transfer of Care (Signed)
Immediate Anesthesia Transfer of Care Note  Patient: Anthony Gregory  Procedure(s) Performed: COLONOSCOPY WITH PROPOFOL ESOPHAGOGASTRODUODENOSCOPY (EGD) WITH PROPOFOL BIOPSY POLYPECTOMY  Patient Location: PACU  Anesthesia Type:General  Level of Consciousness: awake  Airway & Oxygen Therapy: Patient Spontanous Breathing  Post-op Assessment: Report given to RN  Post vital signs: Reviewed and stable  Last Vitals:  Vitals Value Taken Time  BP 88/60 10/03/22 1024  Temp 35.9 C 10/03/22 1024  Pulse 70 10/03/22 1024  Resp 25 10/03/22 1025  SpO2 97 % 10/03/22 1024  Vitals shown include unfiled device data.  Last Pain:  Vitals:   10/03/22 1024  TempSrc: Tympanic  PainSc: Asleep         Complications: No notable events documented.

## 2022-10-04 ENCOUNTER — Encounter: Payer: Self-pay | Admitting: Gastroenterology

## 2022-12-07 DIAGNOSIS — E7800 Pure hypercholesterolemia, unspecified: Secondary | ICD-10-CM | POA: Diagnosis not present

## 2022-12-07 DIAGNOSIS — D649 Anemia, unspecified: Secondary | ICD-10-CM | POA: Diagnosis not present

## 2022-12-07 DIAGNOSIS — D472 Monoclonal gammopathy: Secondary | ICD-10-CM | POA: Diagnosis not present

## 2022-12-07 DIAGNOSIS — C61 Malignant neoplasm of prostate: Secondary | ICD-10-CM | POA: Diagnosis not present

## 2022-12-07 DIAGNOSIS — D696 Thrombocytopenia, unspecified: Secondary | ICD-10-CM | POA: Diagnosis not present

## 2022-12-07 DIAGNOSIS — E538 Deficiency of other specified B group vitamins: Secondary | ICD-10-CM | POA: Diagnosis not present

## 2022-12-09 DIAGNOSIS — H35379 Puckering of macula, unspecified eye: Secondary | ICD-10-CM | POA: Diagnosis not present

## 2022-12-09 DIAGNOSIS — H3554 Dystrophies primarily involving the retinal pigment epithelium: Secondary | ICD-10-CM | POA: Diagnosis not present

## 2022-12-09 DIAGNOSIS — H3589 Other specified retinal disorders: Secondary | ICD-10-CM | POA: Diagnosis not present

## 2022-12-13 DIAGNOSIS — A048 Other specified bacterial intestinal infections: Secondary | ICD-10-CM | POA: Diagnosis not present

## 2022-12-13 DIAGNOSIS — D369 Benign neoplasm, unspecified site: Secondary | ICD-10-CM | POA: Diagnosis not present

## 2023-05-26 DIAGNOSIS — D649 Anemia, unspecified: Secondary | ICD-10-CM | POA: Diagnosis not present

## 2023-05-26 DIAGNOSIS — D472 Monoclonal gammopathy: Secondary | ICD-10-CM | POA: Diagnosis not present

## 2023-05-26 DIAGNOSIS — E7800 Pure hypercholesterolemia, unspecified: Secondary | ICD-10-CM | POA: Diagnosis not present

## 2023-05-26 DIAGNOSIS — D696 Thrombocytopenia, unspecified: Secondary | ICD-10-CM | POA: Diagnosis not present

## 2023-05-26 DIAGNOSIS — E538 Deficiency of other specified B group vitamins: Secondary | ICD-10-CM | POA: Diagnosis not present

## 2023-06-02 DIAGNOSIS — D649 Anemia, unspecified: Secondary | ICD-10-CM | POA: Diagnosis not present

## 2023-06-02 DIAGNOSIS — Z0000 Encounter for general adult medical examination without abnormal findings: Secondary | ICD-10-CM | POA: Diagnosis not present

## 2023-06-02 DIAGNOSIS — D472 Monoclonal gammopathy: Secondary | ICD-10-CM | POA: Diagnosis not present

## 2023-06-02 DIAGNOSIS — E7800 Pure hypercholesterolemia, unspecified: Secondary | ICD-10-CM | POA: Diagnosis not present

## 2023-06-02 DIAGNOSIS — C61 Malignant neoplasm of prostate: Secondary | ICD-10-CM | POA: Diagnosis not present

## 2023-06-02 DIAGNOSIS — K219 Gastro-esophageal reflux disease without esophagitis: Secondary | ICD-10-CM | POA: Diagnosis not present

## 2023-06-02 DIAGNOSIS — E538 Deficiency of other specified B group vitamins: Secondary | ICD-10-CM | POA: Diagnosis not present

## 2023-06-02 DIAGNOSIS — D171 Benign lipomatous neoplasm of skin and subcutaneous tissue of trunk: Secondary | ICD-10-CM | POA: Diagnosis not present

## 2023-06-02 DIAGNOSIS — Z131 Encounter for screening for diabetes mellitus: Secondary | ICD-10-CM | POA: Diagnosis not present

## 2023-06-02 DIAGNOSIS — D696 Thrombocytopenia, unspecified: Secondary | ICD-10-CM | POA: Diagnosis not present

## 2023-06-08 DIAGNOSIS — R222 Localized swelling, mass and lump, trunk: Secondary | ICD-10-CM | POA: Diagnosis not present

## 2023-06-15 DIAGNOSIS — L720 Epidermal cyst: Secondary | ICD-10-CM | POA: Diagnosis not present

## 2024-01-05 DIAGNOSIS — H35379 Puckering of macula, unspecified eye: Secondary | ICD-10-CM | POA: Diagnosis not present

## 2024-01-05 DIAGNOSIS — Z961 Presence of intraocular lens: Secondary | ICD-10-CM | POA: Diagnosis not present

## 2024-01-05 DIAGNOSIS — H3554 Dystrophies primarily involving the retinal pigment epithelium: Secondary | ICD-10-CM | POA: Diagnosis not present

## 2024-01-05 DIAGNOSIS — H43813 Vitreous degeneration, bilateral: Secondary | ICD-10-CM | POA: Diagnosis not present
